# Patient Record
Sex: Female | Born: 1995 | Hispanic: Yes | Marital: Single | Smoking: Never smoker
Health system: Southern US, Community
[De-identification: ages and names within clinical notes are randomized; demographics above are authoritative.]

## PROBLEM LIST (undated history)

## (undated) ENCOUNTER — Ambulatory Visit: Admission: RE | Source: Ambulatory Visit | Attending: Gastroenterology | Admitting: Gastroenterology

## (undated) DIAGNOSIS — K5190 Ulcerative colitis, unspecified, without complications: Secondary | ICD-10-CM

## (undated) DIAGNOSIS — K219 Gastro-esophageal reflux disease without esophagitis: Secondary | ICD-10-CM

## (undated) DIAGNOSIS — K589 Irritable bowel syndrome without diarrhea: Secondary | ICD-10-CM

## (undated) DIAGNOSIS — F3281 Premenstrual dysphoric disorder: Secondary | ICD-10-CM

## (undated) DIAGNOSIS — F32A Depression, unspecified: Secondary | ICD-10-CM

## (undated) DIAGNOSIS — J302 Other seasonal allergic rhinitis: Secondary | ICD-10-CM

## (undated) DIAGNOSIS — F419 Anxiety disorder, unspecified: Secondary | ICD-10-CM

## (undated) DIAGNOSIS — B977 Papillomavirus as the cause of diseases classified elsewhere: Secondary | ICD-10-CM

## (undated) DIAGNOSIS — R87619 Unspecified abnormal cytological findings in specimens from cervix uteri: Secondary | ICD-10-CM

## (undated) DIAGNOSIS — A493 Mycoplasma infection, unspecified site: Secondary | ICD-10-CM

## (undated) DIAGNOSIS — E079 Disorder of thyroid, unspecified: Secondary | ICD-10-CM

## (undated) HISTORY — PX: SPLENECTOMY: SUR1306

## (undated) HISTORY — PX: WISDOM TOOTH EXTRACTION: SHX21

## (undated) HISTORY — PX: SPLENECTOMY, TOTAL: SHX788

## (undated) HISTORY — DX: Mycoplasma infection, unspecified site: A49.3

## (undated) HISTORY — DX: Unspecified abnormal cytological findings in specimens from cervix uteri: R87.619

## (undated) HISTORY — DX: Irritable bowel syndrome, unspecified: K58.9

## (undated) HISTORY — DX: Anxiety disorder, unspecified: F41.9

## (undated) HISTORY — DX: Premenstrual dysphoric disorder: F32.81

## (undated) HISTORY — DX: Other seasonal allergic rhinitis: J30.2

## (undated) HISTORY — DX: Depression, unspecified: F32.A

## (undated) HISTORY — DX: Ulcerative colitis, unspecified, without complications: K51.90

## (undated) HISTORY — PX: APPENDECTOMY (OPEN): SHX54

## (undated) HISTORY — DX: Disorder of thyroid, unspecified: E07.9

## (undated) HISTORY — DX: Papillomavirus as the cause of diseases classified elsewhere: B97.7

---
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
## 2014-01-25 ENCOUNTER — Emergency Department: Payer: Self-pay | Admitting: Emergency Medicine

## 2014-01-25 LAB — URINALYSIS, COMPLETE
BILIRUBIN, UR: NEGATIVE
Bacteria: NONE SEEN
Blood: NEGATIVE
Glucose,UR: NEGATIVE mg/dL (ref 0–75)
Ketone: NEGATIVE
LEUKOCYTE ESTERASE: NEGATIVE
Nitrite: NEGATIVE
PH: 5 (ref 4.5–8.0)
Protein: NEGATIVE
RBC,UR: 1 /HPF (ref 0–5)
SPECIFIC GRAVITY: 1.011 (ref 1.003–1.030)
WBC UR: 1 /HPF (ref 0–5)

## 2014-01-25 LAB — COMPREHENSIVE METABOLIC PANEL
ALT: 20 U/L
Albumin: 3.7 g/dL — ABNORMAL LOW (ref 3.8–5.6)
Alkaline Phosphatase: 94 U/L
Anion Gap: 9 (ref 7–16)
BUN: 11 mg/dL (ref 9–21)
Bilirubin,Total: 0.2 mg/dL (ref 0.2–1.0)
CREATININE: 0.67 mg/dL (ref 0.60–1.30)
Calcium, Total: 8.8 mg/dL — ABNORMAL LOW (ref 9.0–10.7)
Chloride: 107 mmol/L (ref 97–107)
Co2: 25 mmol/L (ref 16–25)
EGFR (African American): >60
EGFR (Non-African Amer.): >60
GLUCOSE: 100 mg/dL — AB (ref 65–99)
OSMOLALITY: 281 (ref 275–301)
Potassium: 4.3 mmol/L (ref 3.3–4.7)
SGOT(AST): 20 U/L (ref 0–26)
Sodium: 141 mmol/L (ref 132–141)
Total Protein: 7.5 g/dL (ref 6.4–8.6)

## 2014-01-25 LAB — CBC WITH DIFFERENTIAL/PLATELET
BASOS PCT: 0.7 %
Basophil #: 0.1 10*3/uL (ref 0.0–0.1)
EOS PCT: 7.4 %
Eosinophil #: 0.8 10*3/uL — ABNORMAL HIGH (ref 0.0–0.7)
HCT: 34.1 % — ABNORMAL LOW (ref 35.0–47.0)
HGB: 10.8 g/dL — ABNORMAL LOW (ref 12.0–16.0)
Lymphocyte #: 2.5 10*3/uL (ref 1.0–3.6)
Lymphocyte %: 24.5 %
MCH: 25.1 pg — AB (ref 26.0–34.0)
MCHC: 31.7 g/dL — ABNORMAL LOW (ref 32.0–36.0)
MCV: 79 fL — ABNORMAL LOW (ref 80–100)
MONO ABS: 1.1 x10 3/mm — AB (ref 0.2–0.9)
Monocyte %: 10.4 %
NEUTROS PCT: 57 %
Neutrophil #: 5.9 10*3/uL (ref 1.4–6.5)
PLATELETS: 686 10*3/uL — AB (ref 150–440)
RBC: 4.31 10*6/uL (ref 3.80–5.20)
RDW: 16.6 % — ABNORMAL HIGH (ref 11.5–14.5)
WBC: 10.4 10*3/uL (ref 3.6–11.0)

## 2014-01-25 LAB — LIPASE, BLOOD: LIPASE: 98 U/L (ref 73–393)

## 2014-06-18 ENCOUNTER — Encounter (INDEPENDENT_AMBULATORY_CARE_PROVIDER_SITE_OTHER): Payer: Self-pay | Admitting: Physician Assistant

## 2014-06-18 ENCOUNTER — Ambulatory Visit (INDEPENDENT_AMBULATORY_CARE_PROVIDER_SITE_OTHER): Payer: Commercial Managed Care - PPO | Admitting: Physician Assistant

## 2014-06-18 VITALS — BP 122/76 | HR 83 | Temp 98.2°F | Ht 65.0 in | Wt 173.0 lb

## 2014-06-18 DIAGNOSIS — M25561 Pain in right knee: Secondary | ICD-10-CM

## 2014-06-18 DIAGNOSIS — K51819 Other ulcerative colitis with unspecified complications: Secondary | ICD-10-CM

## 2014-06-18 DIAGNOSIS — M25562 Pain in left knee: Secondary | ICD-10-CM

## 2014-06-18 DIAGNOSIS — K5190 Ulcerative colitis, unspecified, without complications: Secondary | ICD-10-CM | POA: Insufficient documentation

## 2014-06-18 NOTE — Progress Notes (Signed)
Subjective:       Patient ID: Cheryl Mcneil is a 19 y.o. female.    Knee Pain   Incident onset: over 1 month. There was no injury mechanism. The pain is present in the right knee and left knee. The pain is moderate. The pain has been fluctuating since onset. Pertinent negatives include no inability to bear weight, loss of motion, numbness or tingling. She reports no foreign bodies present. The symptoms are aggravated by movement and weight bearing. She has tried acetaminophen for the symptoms. The treatment provided mild relief.   Her sx bother her most when she is dancing    Of note she was recent diagnosed with UC.  First flare was severe and she was hospitalized for 3 weeks for intestinal abscess.  Since that time she was on high dose steroids which she has tapered off from. She is closely followed by GI and reports compliance with current meds    Her knee sx stared as she tapered off the steroids    Current Outpatient Prescriptions   Medication Sig Dispense Refill   . azaTHIOprine (IMURAN) 50 MG tablet Take 50 mg by mouth daily.     . ferrous sulfate 220 (44 FE) MG/5ML solution Take 220 mg by mouth daily.     . mesalamine (LIALDA) 1.2 G EC tablet Take 1,200 mg by mouth 4 (four) times daily.     . pantoprazole (PROTONIX) 40 MG tablet Take 1 tablet (40 mg total) by mouth daily. 30 tablet 0   . b complex vitamins tablet Take 1 tablet by mouth daily.     . Cholecalciferol (VITAMIN D PO) Take by mouth.     . Multiple Vitamin (MULTIVITAMIN) tablet Take 1 tablet by mouth daily.     . ondansetron (ZOFRAN ODT) 8 MG disintegrating tablet Take 1 tablet (8 mg total) by mouth every 8 (eight) hours as needed for Nausea. 4 tablet 0     No current facility-administered medications for this visit.         The following portions of the patient's history were reviewed and updated as appropriate: allergies, current medications, past family history, past medical history, past social history, past surgical history and problem  list.    Review of Systems   Constitutional: Negative for fever and fatigue.   Gastrointestinal: Positive for abdominal pain and diarrhea. Negative for nausea, blood in stool and anal bleeding.   Musculoskeletal: Positive for arthralgias. Negative for gait problem.   Skin: Negative for color change.   Neurological: Negative for tingling, weakness and numbness.           Objective:    Physical Exam   Constitutional: She appears well-developed and well-nourished. No distress.   HENT:   Head: Normocephalic.   Musculoskeletal:        Right knee: She exhibits normal range of motion, no swelling, no effusion, no ecchymosis and normal alignment. No tenderness found.        Left knee: She exhibits normal range of motion, no swelling, no effusion and normal alignment. No tenderness found.           Assessment:       1. Other ulcerative colitis with complication    2. Knee pain, bilateral             Plan:      Procedures    1. Other ulcerative colitis with complication     2. Knee pain, bilateral  Ambulatory referral to Rheumatology  Follow up GI as planned  Tylenol PRN  Ice to area

## 2014-09-12 ENCOUNTER — Ambulatory Visit (INDEPENDENT_AMBULATORY_CARE_PROVIDER_SITE_OTHER): Payer: Commercial Managed Care - PPO | Admitting: Internal Medicine

## 2014-09-12 VITALS — BP 110/75 | HR 76 | Temp 98.4°F | Ht 66.0 in | Wt 173.0 lb

## 2014-09-12 DIAGNOSIS — K5120 Ulcerative (chronic) proctitis without complications: Secondary | ICD-10-CM

## 2014-09-12 DIAGNOSIS — M2550 Pain in unspecified joint: Secondary | ICD-10-CM | POA: Insufficient documentation

## 2014-09-12 DIAGNOSIS — M25569 Pain in unspecified knee: Secondary | ICD-10-CM | POA: Insufficient documentation

## 2014-09-12 LAB — C-REACTIVE PROTEIN: C-Reactive Protein: <0.1 mg/dL (ref 0.0–0.8)

## 2014-09-12 LAB — SEDIMENTATION RATE: Sed Rate: 7 mm/Hr (ref 0–20)

## 2014-09-12 LAB — RHEUMATOID FACTOR: Rheumatoid Factor: <10 (ref 0.0–30.0)

## 2014-09-12 NOTE — Patient Instructions (Signed)
1. Check RF, CCP, ANA, ESR and CRP   2. Continue Humira  3. Suggest sulfasalazine if still having joint pain   4. Follow 3-4 months. She will call labs results

## 2014-09-12 NOTE — Progress Notes (Signed)
Has the patient sought any care outside of the [facility]? YES    -Refer to care team-   Or  List Specialists:    Yes, listed under Care Team.

## 2014-09-12 NOTE — Progress Notes (Signed)
Initial Rheumatology Consultation    Chief Complaint:     Chief Complaint   Patient presents with   . Initial Consult     Knee Pain          HPI:   This patient is a 19 y.o. year old female with ulcerative colitis, s/p splenectomy  referred by Meth, Garlan Fair, PA to evaluate joint pain. She has intermittent pain of knees and ankles for 2-3 months. She was diagnosed with ulcerative colitis in 2015. She has been on Humira for 3 months. She tried imuran and mesalamine without help. Currently She follows with GI Dr. Elnoria Howard at [facility]. She has flare up with bloody diaheas 4-5 times per day. She takes Tylenol as needed.     Denies any fevers, chills or night sweats.  Denies fatigue or weight loss. Denies rash, malar rash or photosensitivity, discoid lesions, psoriasis, nail changes or Raynaud's. Denies hair loss or alopecia. Denies Sicca symptoms, nasal or oral ulcers or epistaxis. Denies cough, shortness of breath, chest pain, palpation or exercise intolerance. Denies nausea, vomiting, diarrhea, melena, hematochezia or dysphagia.  Denies dysuria and hematuria. Denies chronic headaches, paresthesias, focal weakness or seizure activity.      Denies any family history of lupus, rheumatoid arthritis, connective tissue disease, psoriasis, inflammatory bowel disease, ankylosing spondylitis, multiple sclerosis or other autoimmune diseases.        PMSH:     Past Medical History   Diagnosis Date   . Seasonal allergies    . Concussion 2014   . Ulcerative colitis        Past Surgical History   Procedure Laterality Date   . Wisdom tooth extraction  07/2012     All 4 tooth       Allergies:   No Known Allergies    Meds:     Current outpatient prescriptions:   .  Adalimumab (HUMIRA SC), Inject into the skin., Disp: , Rfl:   .  b complex vitamins tablet, Take 1 tablet by mouth daily., Disp: , Rfl:   .  ferrous sulfate 220 (44 FE) MG/5ML solution, Take 220 mg by mouth daily., Disp: , Rfl:   .  Multiple Vitamin (MULTIVITAMIN) tablet,  Take 1 tablet by mouth daily., Disp: , Rfl:   .  ondansetron (ZOFRAN ODT) 8 MG disintegrating tablet, Take 1 tablet (8 mg total) by mouth every 8 (eight) hours as needed for Nausea., Disp: 4 tablet, Rfl: 0    FH:     Family History   Problem Relation Age of Onset   . Heart disease Maternal Uncle      Pace marker   . Diabetes Maternal Grandmother    . Heart disease Maternal Grandmother    . Heart disease Maternal Grandfather    . Diabetes Paternal Grandmother    . Thyroid disease Paternal Grandmother    . Diabetes Paternal Grandfather    . Thyroid disease Father        SH:     History     Social History   . Marital Status: Single     Spouse Name: N/A   . Number of Children: N/A   . Years of Education: N/A     Occupational History   . Not on file.     Social History Main Topics   . Smoking status: Never Smoker    . Smokeless tobacco: Never Used   . Alcohol Use: No   . Drug Use: No   . Sexual Activity:  Not on file     Other Topics Concern   . Not on file     Social History Narrative       ROS:   All other systems reviewed and negative except as described above as HPI.       PHYSICAL EXAM:     Filed Vitals:    09/12/14 0917   BP: 110/75   Pulse: 76   Temp: 98.4 F (36.9 C)       General appearance - alert, well appearing, and in no distress  Mental status - alert, oriented to person, place, and time  Eyes - pupils equal and reactive, extraocular eye movements intact  Ears - bilateral TM's and external ear canals normal  Nose - normal and patent, no erythema, discharge or polyps  Mouth - mucous membranes moist, pharynx normal without lesions  Neck - supple, no significant adenopathy  Lymphatics - no palpable lymphadenopathy, no hepatosplenomegaly  Chest - clear to auscultation, no wheezes, rales or rhonchi, symmetric air entry  Heart - normal rate, regular rhythm, normal S1, S2, no murmurs, rubs, clicks or gallops  Abdomen - soft, nontender, nondistended, no masses or organomegaly  Back exam - full range of motion, no  tenderness, palpable spasm or pain on motion  Neurological - alert, oriented, normal speech, no focal findings or movement disorder noted  Musculoskeletal - Good range of cervical spine.  No crepitus on TMJ auscultation.  No tenderness of MCPs and PIPs. Good range of motion of shoulders, elbows, wrists and small joints of hands. Good range of motion of hips, knees and ankles.  Knees crepitus with range of motion of knees without effusions. Ankles without effusions.  No tenderness of MTPs or effusions.    Extremities - peripheral pulses normal, no pedal edema, no clubbing or cyanosis  Skin - normal coloration and turgor, no rashes, no suspicious skin lesions noted        Labs:       Radiology:         ASSESSMENT:   Cheryl Mcneil is a 19 y.o. female with ulcerative colitis, s/p splenectomy. 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 Will evaluate rheumatoid arthritis.       PLAN:   1. Check RF, CCP, ANA, ESR and CRP   2. Continue Humira  3. Suggest sulfasalazine if still having joint pain   4. Follow 3-4 months. She will call labs results

## 2014-09-14 LAB — CYCLIC CITRUL PEPTIDE ANTIBODY, IGG: CCP, Antibody IgG: <16 (ref <20)

## 2014-09-14 LAB — ANA IFA W/REFLEX TO TITER/PATTERN/AB: ANA Screen, IFA: NEGATIVE

## 2014-10-02 ENCOUNTER — Other Ambulatory Visit: Payer: Self-pay

## 2014-10-02 ENCOUNTER — Emergency Department
Admission: EM | Admit: 2014-10-02 | Discharge: 2014-10-02 | Disposition: A | Discharge status: Home / Self Care | Payer: Commercial Managed Care - PPO | Attending: Emergency Medicine | Admitting: Emergency Medicine

## 2014-10-02 ENCOUNTER — Ambulatory Visit (INDEPENDENT_AMBULATORY_CARE_PROVIDER_SITE_OTHER): Payer: Commercial Managed Care - PPO | Admitting: Family Medicine

## 2014-10-02 ENCOUNTER — Encounter (INDEPENDENT_AMBULATORY_CARE_PROVIDER_SITE_OTHER): Payer: Self-pay | Admitting: Family Medicine

## 2014-10-02 ENCOUNTER — Emergency Department: Payer: Commercial Managed Care - PPO

## 2014-10-02 VITALS — BP 117/68 | HR 96 | Temp 97.9°F | Ht 65.0 in | Wt 169.0 lb

## 2014-10-02 DIAGNOSIS — R1013 Epigastric pain: Secondary | ICD-10-CM

## 2014-10-02 DIAGNOSIS — K5190 Ulcerative colitis, unspecified, without complications: Secondary | ICD-10-CM | POA: Insufficient documentation

## 2014-10-02 DIAGNOSIS — K2970 Gastritis, unspecified, without bleeding: Secondary | ICD-10-CM | POA: Insufficient documentation

## 2014-10-02 LAB — CBC AND DIFFERENTIAL
Basophils Absolute Automated: 0.03 10*3/uL (ref 0.00–0.20)
Basophils Automated: 0 %
Eosinophils Absolute Automated: 0.22 10*3/uL (ref 0.00–0.70)
Eosinophils Automated: 1 %
Hematocrit: 37.5 % (ref 37.0–47.0)
Hgb: 12.9 g/dL (ref 12.0–16.0)
Immature Granulocytes Absolute: 0.05 10*3/uL
Immature Granulocytes: 0 %
Lymphocytes Absolute Automated: 3.17 10*3/uL (ref 0.50–4.40)
Lymphocytes Automated: 19 %
MCH: 28.4 pg (ref 28.0–32.0)
MCHC: 34.4 g/dL (ref 32.0–36.0)
MCV: 82.4 fL (ref 80.0–100.0)
MPV: 9.1 fL — ABNORMAL LOW (ref 9.4–12.3)
Monocytes Absolute Automated: 1.11 10*3/uL (ref 0.00–1.20)
Monocytes: 7 %
Neutrophils Absolute: 12.22 10*3/uL — ABNORMAL HIGH (ref 1.80–8.10)
Neutrophils: 73 %
Nucleated RBC: 0 /100 WBC (ref 0–1)
Platelets: 583 10*3/uL — ABNORMAL HIGH (ref 140–400)
RBC: 4.55 10*6/uL (ref 4.20–5.40)
RDW: 15 % (ref 12–15)
WBC: 16.8 10*3/uL — ABNORMAL HIGH (ref 3.50–10.80)

## 2014-10-02 LAB — HEPATIC FUNCTION PANEL
ALT: 8 U/L (ref 0–55)
AST (SGOT): 14 U/L (ref 5–34)
Albumin/Globulin Ratio: 1.5 (ref 0.9–2.2)
Albumin: 4.4 g/dL (ref 3.5–5.0)
Alkaline Phosphatase: 85 U/L (ref 50–130)
Bilirubin Direct: 0.2 mg/dL (ref 0.0–0.5)
Bilirubin Indirect: 0.4 mg/dL (ref 0.0–1.1)
Bilirubin, Total: 0.6 mg/dL (ref 0.2–1.2)
Globulin: 2.9 g/dL (ref 2.0–3.6)
Protein, Total: 7.3 g/dL (ref 6.0–8.3)

## 2014-10-02 LAB — URINALYSIS, REFLEX TO MICROSCOPIC EXAM IF INDICATED
Bilirubin, UA: NEGATIVE
Blood, UA: NEGATIVE
Glucose, UA: NEGATIVE
Ketones UA: 10
Leukocyte Esterase, UA: NEGATIVE
Nitrite, UA: NEGATIVE
Specific Gravity UA: 1.019 (ref 1.001–1.035)
Urine pH: 6.5 (ref 5.0–8.0)
Urobilinogen, UA: 2 mg/dL (ref 0.2–2.0)

## 2014-10-02 LAB — POCT PREGNANCY TEST, URINE HCG: POCT Pregnancy HCG Test, UR: NEGATIVE

## 2014-10-02 LAB — I-STAT CG4 VENOUS CARTRIDGE
Lactic Acid I-Stat: 0.8 mmol/L (ref 0.2–2.0)
i-STAT Base Excess Venous: 0 mEq/L
i-STAT FIO2: 21
i-STAT HCO3 Bicarbonate Venous: 25.3 mEq/L
i-STAT O2 Saturation Venous: 60 %
i-STAT Patient Temperature: 98.6
i-STAT Total CO2 Venous: 26 mEq/L
i-STAT pCO2 Venous: 40.6
i-STAT pH Venous: 7.402
i-STAT pO2 Venous: 31

## 2014-10-02 LAB — BASIC METABOLIC PANEL
BUN: 11 mg/dL (ref 7.0–19.0)
CO2: 23 mEq/L (ref 22–29)
Calcium: 9.7 mg/dL (ref 8.5–10.5)
Chloride: 107 mEq/L (ref 100–111)
Creatinine: 0.9 mg/dL (ref 0.6–1.0)
Glucose: 90 mg/dL (ref 70–100)
Potassium: 4 mEq/L (ref 3.5–5.1)
Sodium: 140 mEq/L (ref 136–145)

## 2014-10-02 LAB — LIPASE: Lipase: 18 U/L (ref 8–78)

## 2014-10-02 MED ORDER — ONDANSETRON HCL 4 MG/2ML IJ SOLN
4.0000 mg | Freq: Once | INTRAMUSCULAR | Status: AC
Start: 2014-10-02 — End: 2014-10-02
  Administered 2014-10-02: 4 mg via INTRAVENOUS
  Filled 2014-10-02: qty 2

## 2014-10-02 MED ORDER — ALUM & MAG HYDROXIDE-SIMETH 200-200-20 MG/5ML PO SUSP
30.0000 mL | Freq: Once | ORAL | Status: AC
Start: 2014-10-02 — End: 2014-10-02
  Administered 2014-10-02: 30 mL via ORAL
  Filled 2014-10-02: qty 30

## 2014-10-02 MED ORDER — FAMOTIDINE 10 MG/ML IV SOLN (WRAP)
20.0000 mg | Freq: Once | INTRAVENOUS | Status: AC
Start: 2014-10-02 — End: 2014-10-02
  Administered 2014-10-02: 20 mg via INTRAVENOUS
  Filled 2014-10-02: qty 2

## 2014-10-02 MED ORDER — PROMETHAZINE HCL 25 MG PO TABS
25.0000 mg | ORAL_TABLET | Freq: Four times a day (QID) | ORAL | 0 refills | Status: DC | PRN
Start: 2014-10-02 — End: 2014-10-03
  Filled 2014-10-02: qty 20, 5d supply, fill #0

## 2014-10-02 MED ORDER — HYDROMORPHONE HCL 1 MG/ML IJ SOLN
1.0000 mg | Freq: Once | INTRAMUSCULAR | Status: AC
Start: 2014-10-02 — End: 2014-10-02
  Administered 2014-10-02: 1 mg via INTRAVENOUS
  Filled 2014-10-02: qty 1

## 2014-10-02 MED ORDER — ONDANSETRON 8 MG PO TBDP
8.0000 mg | ORAL_TABLET | Freq: Three times a day (TID) | ORAL | Status: DC | PRN
Start: 2014-10-02 — End: 2014-10-03

## 2014-10-02 MED ORDER — LIDOCAINE VISCOUS 2 % MT SOLN
10.0000 mL | Freq: Once | OROMUCOSAL | Status: AC
Start: 2014-10-02 — End: 2014-10-02
  Administered 2014-10-02: 10 mL via OROMUCOSAL
  Filled 2014-10-02: qty 15

## 2014-10-02 MED ORDER — SODIUM CHLORIDE 0.9 % IV BOLUS
1000.0000 mL | Freq: Once | INTRAVENOUS | Status: AC
Start: 2014-10-02 — End: 2014-10-02
  Administered 2014-10-02: 1000 mL via INTRAVENOUS

## 2014-10-02 NOTE — ED Notes (Signed)
19 y.o. 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

## 2014-10-02 NOTE — ED Provider Notes (Signed)
[facility] EMERGENCY DEPARTMENT RESIDENT H&P       CLINICAL INFORMATION        HPI:      Chief Complaint: Abdominal Pain  .    Cheryl Mcneil is a 19 y.o. female with h/o UC on [place] and splenectomy following ?sepsis from liver abscess p/w 2 days of colicky epigastric and LLQ pain a/w nausea and vomiting. Pt was seen by PMD today and given Zofran but continued to vomit. 3 episodes of NBNB emesis today. Endorses loose BMs c/w UC flare. Missed last humera shot due to URI. Pt denies f/c, h/a, CP, SOB, pelvic pain, dysuria, hematuria, hematochezia. Last BM this morning.      History obtained from: patient, family             ROS:      Review of Systems   Constitutional: Positive for appetite change. Negative for fever, chills, diaphoresis, activity change and fatigue.   HENT: Negative.  Negative for sore throat, trouble swallowing and voice change.    Eyes: Negative.  Negative for visual disturbance.   Respiratory: Negative.  Negative for cough, choking, chest tightness, shortness of breath and wheezing.    Cardiovascular: Negative for chest pain, palpitations and leg swelling.   Gastrointestinal: Positive for abdominal pain. Negative for nausea, vomiting, constipation, blood in stool and abdominal distention.        Soft stools c/w baseline   Genitourinary: Negative.  Negative for dysuria, frequency, hematuria, flank pain and pelvic pain.   Musculoskeletal: Negative.  Negative for back pain, joint swelling and arthralgias.   Skin: Negative.    Neurological: Negative.  Negative for dizziness, syncope, facial asymmetry, weakness, light-headedness, numbness and headaches.         Physical Exam:      Pulse 88  BP 131/80 mmHg  Resp 18  SpO2 98 %  Temp 98.6 F (37 C)    Physical Exam   Constitutional: She is oriented to person, place, and time. She appears well-developed and well-nourished. No distress.   HENT:   Head: Normocephalic and atraumatic.   Mouth/Throat: Oropharynx is clear and  moist. No oropharyngeal exudate.   Eyes: Conjunctivae and EOM are normal. Pupils are equal, round, and reactive to light. No scleral icterus.   Neck: Normal range of motion.   Cardiovascular: Normal rate, regular rhythm and normal heart sounds.    No murmur heard.  Pulmonary/Chest: Effort normal and breath sounds normal. No respiratory distress. She has no wheezes.   Abdominal: Soft. Bowel sounds are normal. She exhibits no distension and no mass. There is tenderness. There is no rebound and no guarding. No hernia.   TTP in epigastric and LUQ. Tender parallel to scar from open splenectomy. No obvious hernia.    Musculoskeletal: Normal range of motion. She exhibits no edema or tenderness.   Lymphadenopathy:     She has no cervical adenopathy.   Neurological: She is alert and oriented to person, place, and time.   Skin: Skin is warm and dry. She is not diaphoretic.   Psychiatric: She has a normal mood and affect.   Vitals reviewed.              PAST HISTORY        Primary Care Provider: Roe Rutherford, PA        PMH/PSH:    .     Past Medical History   Diagnosis Date   . Seasonal allergies    . Concussion 2014   .  Ulcerative colitis        She has past surgical history that includes Wisdom tooth extraction (07/2012) and Splenectomy, total.      Social/Family History:      She reports that she has never smoked. She has never used smokeless tobacco. She reports that she does not drink alcohol or use illicit drugs.    Family History   Problem Relation Age of Onset   . Heart disease Maternal Uncle      Pace marker   . Diabetes Maternal Grandmother    . Heart disease Maternal Grandmother    . Heart disease Maternal Grandfather    . Diabetes Paternal Grandmother    . Thyroid disease Paternal Grandmother    . Diabetes Paternal Grandfather    . Thyroid disease Father          Listed Medications on Arrival:    .     Home Medications     Last Medication Reconciliation Action:  Complete Margy Clarks, RN 10/02/2014  4:55 PM                   Adalimumab (HUMIRA SC)     Inject into the skin.     b complex vitamins tablet     Take 1 tablet by mouth daily.     ferrous sulfate 220 (44 FE) MG/5ML solution     Take 220 mg by mouth daily.     mesalamine (LIALDA) 1.2 G EC tablet     Take 1,200 mg by mouth every morning with breakfast.     Multiple Vitamin (MULTIVITAMIN) tablet     Take 1 tablet by mouth daily.     ondansetron (ZOFRAN ODT) 8 MG disintegrating tablet     Take 1 tablet (8 mg total) by mouth every 8 (eight) hours as needed for Nausea.     pantoprazole (PROTONIX) 40 MG tablet     Take 40 mg by mouth daily.                   Allergies: She has No Known Allergies.            VISIT INFORMATION        Reassessments/Clinical Course:      Sxs c/w gastritis however pt is immunosuppressed and had multiple abdominal surgeries. Given GI cocktail. Concern for gallstones given colicky nature of pain however pt has had HIDA scan 1 year ago. Plan for RUQ u/s. SBO unlikely given recent bowel movement.    Lab work very reassuring. Leukocytosis likely due to splenectomy and h/o prednisone use. RUQ shows no acute pathology. Plan for d/c home with treatment for gastritis. Pt endorses poor diet with intake of acidic and harsh foods. Counseled on dietary changes. Plan for PMD follow up in 2-3 days.       Conversations with Other Providers:              Medications Given in the ED:    .     ED Medication Orders     Start Ordered     Status Ordering Provider    10/02/14 1700 10/02/14 1659  lidocaine viscous (XYLOCAINE) 2 % mouth solution 10 mL   Once     Route: Mouth/Throat  Ordered Dose: 10 mL     Ordered GIRMA, BELETSHACHEW R    10/02/14 1700 10/02/14 1659  alum & mag hydroxide-simethicone (MAALOX PLUS) 200-200-20 mg/5 mL suspension 30 mL   Once     Route:  Oral  Ordered Dose: 30 mL     Ordered GIRMA, BELETSHACHEW R    10/02/14 1700 10/02/14 1659  famotidine (PEPCID) injection 20 mg   Once     Route: Intravenous  Ordered Dose: 20 mg     Ordered GIRMA,  BELETSHACHEW R    10/02/14 1615 10/02/14 1614  ondansetron (ZOFRAN) injection 4 mg   Once     Route: Intravenous  Ordered Dose: 4 mg     Last MAR action:  Given WOODS, AMIE HALL    10/02/14 1615 10/02/14 1614  HYDROmorphone (DILAUDID) injection 1 mg   Once     Route: Intravenous  Ordered Dose: 1 mg     Last MAR action:  Given WOODS, AMIE HALL    10/02/14 1615 10/02/14 1614  sodium chloride 0.9 % bolus 1,000 mL   Once     Route: Intravenous  Ordered Dose: 1,000 mL     Last MAR action:  New Bag WOODS, AMIE HALL            Procedures:      Procedures      Assessment/Plan:      19 yo F p/w intermittent epigastric pain and nausea. Lab work and RUQ reassuring. Pt well appearing. Hx and PE c/w gastritis. Pt counseled on her diet. Plan for PMD f/u in 2-3 days.            Diego Cory, MD  Resident  10/02/14 1725    Diego Cory, MD  Resident  10/02/14 [phone]

## 2014-10-02 NOTE — Discharge Instructions (Signed)
Gastritis    You have been diagnosed with gastritis.    Gastritis is an irritation of the stomach lining. It has a number of causes including the use of aspirin and other anti-inflammatory medicines like ibuprofen (Advil or Motrin), naproxen (Naprosyn or Aleve), etc. Other causes are infections and too much stomach acid. Drinking alcohol can also cause gastritis.    You may be prescribed medicines to lower the amount of acid in the stomach or otherwise protect the stomach lining.    Use an antacid (Maalox and Mylanta) as directed on the bottle (1-2 tablespoons or 5-10 ml four times a day).    Avoid caffeine and alcohol. Avoid aspirin and other anti-inflammatory medicines like ibuprofen (Advil or Motrin), naproxen (Naprosyn or Aleve), etc. Acetaminophen (Tylenol) is safe for your stomach.    Spicy foods and acidic foods may increase pain. However, they do not damage the stomach lining. Avoid these foods if they cause pain.    You may be referred to a stomach specialist (Gastroenterologist) for further evaluation.    YOU SHOULD SEEK MEDICAL ATTENTION IMMEDIATELY, EITHER HERE OR AT THE NEAREST EMERGENCY DEPARTMENT, IF ANY OF THE FOLLOWING OCCURS:   Your pain suddenly gets worse.   You vomit (throw up) repeatedly or vomit blood or material that looks like "coffee grounds."   Any blood is in your stool or your stool gets very dark or looks like tar.   You develop a fever (temperature higher than 100.4F / 38C) or shaking chills.

## 2014-10-02 NOTE — Progress Notes (Signed)
Chief Complaint   Patient presents with   . Emesis     onset X1 day          S:    Pt with hx UC and currently on Humira.  Was with URI about 2 weeks ago and rheumatology advised holding Humira until sx resolved.  2 days ago with nausea and abdominal cramps and some intermittent vomiting (nonbloody and not black).  Transient tactile fever early this AM.  No dysuria.  +Diarrhea.  LMP a couple weeks ago but uses norplanon and no vaginal sx.    No Known Allergies    Current Outpatient Prescriptions   Medication Sig Dispense Refill   . Adalimumab (HUMIRA SC) Inject into the skin.     Marland Kitchen b complex vitamins tablet Take 1 tablet by mouth daily.     . ferrous sulfate 220 (44 FE) MG/5ML solution Take 220 mg by mouth daily.     . Multiple Vitamin (MULTIVITAMIN) tablet Take 1 tablet by mouth daily.     . ondansetron (ZOFRAN ODT) 8 MG disintegrating tablet Take 1 tablet (8 mg total) by mouth every 8 (eight) hours as needed for Nausea. 15 tablet 0     No current facility-administered medications for this visit.       Review of Systems   Constitutional: Positive for malaise/fatigue. Negative for fever, chills and diaphoresis.   HENT: Positive for congestion and sore throat. Negative for ear pain.    Respiratory: Positive for cough. Negative for shortness of breath and wheezing.    Cardiovascular: Negative.  Negative for chest pain.   Gastrointestinal: Positive for nausea and vomiting.   Genitourinary: Negative for dysuria, hematuria and flank pain.   Musculoskeletal: Negative for myalgias.   Skin: Negative for rash.   Neurological: Negative for dizziness, weakness and headaches.       All other systems were reviewed and are negative    O:  BP 117/68 mmHg  Pulse 96  Temp(Src) 97.9 F (36.6 C) (Oral)  Ht 1.651 m (5\' 5" )  Wt 76.658 kg (169 lb)  BMI 28.12 kg/m2, Body mass index is 28.12 kg/(m^2).  Vital signs reviewed  GEN:  NAD, nonobese  NEURO:  CN2-12 without focal defecit, nml gait and station  HEENT:  nml eyes, TMs, nares,  OP, no ac LA, normal thyroid evaluation  CVS:  rrr -m/r/g  ABD:  Soft, mild diffuse tenderness worst in epigastrum, ND, +BS  SKIN:  Warm, dry  EXT:  No edema, +2 radial pulse b/l    A/P:     1. Epigastric pain  ondansetron (ZOFRAN ODT) 8 MG disintegrating tablet         Risk & Benefits of the new medication(s) were explained to the patient (and family) who verbalized understanding & agreed to the treatment plan. Patient (family) encouraged to contact me/clinical staff with any questions/concerns    Raj Janus, MD

## 2014-10-02 NOTE — ED Provider Notes (Signed)
ATTENDING NOTE    I have reviewed and agree with history. The pertinent physical exam has been documented.   TEACHING PHYSICAN NOTE AND PE    seen and examined  agree with resident  19 y.o female with PMHX UC here with epigastric pain. Pt has chronic nausea on zofran continues to be nauseous. No fever chill cp sob. Sent by GI for sono. Pt non compliant with diet states has chocolate, tomato sauce .   vs noted  well appearing  cv rrr lung clear ab soft mild ttp epigastric ext no edema neuro non focal  a/p 19 y.o here with epigastric pain  lab sono  Reeval.  6:55 PM  Reexamined ab soft  Will d/c follow up with GI  tolerating po         Joice Lofts, MD  10/03/14 0200

## 2014-10-02 NOTE — Progress Notes (Signed)
1. Have you self referred yourself since we last saw you?Yes    Refer to care team   Or  Add specialists:  GI  Date:09/17/2014

## 2014-10-03 ENCOUNTER — Telehealth (INDEPENDENT_AMBULATORY_CARE_PROVIDER_SITE_OTHER): Payer: Self-pay

## 2014-10-03 ENCOUNTER — Emergency Department
Admission: EM | Admit: 2014-10-03 | Discharge: 2014-10-03 | Disposition: A | Discharge status: Home / Self Care | Payer: Commercial Managed Care - PPO | Attending: Emergency Medicine | Admitting: Emergency Medicine

## 2014-10-03 ENCOUNTER — Emergency Department: Payer: Commercial Managed Care - PPO

## 2014-10-03 DIAGNOSIS — R112 Nausea with vomiting, unspecified: Secondary | ICD-10-CM

## 2014-10-03 LAB — CBC AND DIFFERENTIAL
Basophils Absolute Automated: 0.03 10*3/uL (ref 0.00–0.20)
Basophils Automated: 0 %
Eosinophils Absolute Automated: 0.06 10*3/uL (ref 0.00–0.70)
Eosinophils Automated: 0 %
Hematocrit: 37.8 % (ref 37.0–47.0)
Hgb: 13.1 g/dL (ref 12.0–16.0)
Immature Granulocytes Absolute: 0.04 10*3/uL
Immature Granulocytes: 0 %
Lymphocytes Absolute Automated: 2.44 10*3/uL (ref 0.50–4.40)
Lymphocytes Automated: 15 %
MCH: 28.6 pg (ref 28.0–32.0)
MCHC: 34.7 g/dL (ref 32.0–36.0)
MCV: 82.5 fL (ref 80.0–100.0)
MPV: 8.9 fL — ABNORMAL LOW (ref 9.4–12.3)
Monocytes Absolute Automated: 1.08 10*3/uL (ref 0.00–1.20)
Monocytes: 6 %
Neutrophils Absolute: 12.85 10*3/uL — ABNORMAL HIGH (ref 1.80–8.10)
Neutrophils: 78 %
Nucleated RBC: 0 /100 WBC (ref 0–1)
Platelets: 566 10*3/uL — ABNORMAL HIGH (ref 140–400)
RBC: 4.58 10*6/uL (ref 4.20–5.40)
RDW: 15 % (ref 12–15)
WBC: 16.5 10*3/uL — ABNORMAL HIGH (ref 3.50–10.80)

## 2014-10-03 LAB — HEPATIC FUNCTION PANEL
ALT: 7 U/L (ref 0–55)
AST (SGOT): 13 U/L (ref 5–34)
Albumin/Globulin Ratio: 1.5 (ref 0.9–2.2)
Albumin: 4.2 g/dL (ref 3.5–5.0)
Alkaline Phosphatase: 80 U/L (ref 50–130)
Bilirubin Direct: 0.2 mg/dL (ref 0.0–0.5)
Bilirubin Indirect: 0.4 mg/dL (ref 0.0–1.1)
Bilirubin, Total: 0.6 mg/dL (ref 0.2–1.2)
Globulin: 2.8 g/dL (ref 2.0–3.6)
Protein, Total: 7 g/dL (ref 6.0–8.3)

## 2014-10-03 LAB — BASIC METABOLIC PANEL
BUN: 12 mg/dL (ref 7.0–19.0)
CO2: 19 mEq/L — ABNORMAL LOW (ref 22–29)
Calcium: 9.4 mg/dL (ref 8.5–10.5)
Chloride: 109 mEq/L (ref 100–111)
Creatinine: 0.8 mg/dL (ref 0.6–1.0)
Glucose: 90 mg/dL (ref 70–100)
Potassium: 4.1 mEq/L (ref 3.5–5.1)
Sodium: 140 mEq/L (ref 136–145)

## 2014-10-03 LAB — LIPASE: Lipase: 22 U/L (ref 8–78)

## 2014-10-03 MED ORDER — METOCLOPRAMIDE HCL 5 MG/ML IJ SOLN
10.0000 mg | Freq: Once | INTRAMUSCULAR | Status: AC
Start: 2014-10-03 — End: 2014-10-03
  Administered 2014-10-03: 10 mg via INTRAVENOUS
  Filled 2014-10-03: qty 2

## 2014-10-03 MED ORDER — DIPHENHYDRAMINE HCL 50 MG/ML IJ SOLN
25.0000 mg | Freq: Once | INTRAMUSCULAR | Status: AC
Start: 2014-10-03 — End: 2014-10-03
  Administered 2014-10-03: 25 mg via INTRAVENOUS
  Filled 2014-10-03: qty 1

## 2014-10-03 MED ORDER — DICYCLOMINE HCL 20 MG PO TABS
20.0000 mg | ORAL_TABLET | Freq: Three times a day (TID) | ORAL | Status: AC | PRN
Start: 2014-10-03 — End: 2014-10-13

## 2014-10-03 MED ORDER — SUCRALFATE 1 G PO TABS
1.0000 g | ORAL_TABLET | Freq: Four times a day (QID) | ORAL | Status: DC
Start: 2014-10-03 — End: 2023-11-03

## 2014-10-03 MED ORDER — PANTOPRAZOLE SODIUM 40 MG IV SOLR
40.0000 mg | Freq: Once | INTRAVENOUS | Status: AC
Start: 2014-10-03 — End: 2014-10-03
  Administered 2014-10-03: 40 mg via INTRAVENOUS
  Filled 2014-10-03: qty 40

## 2014-10-03 MED ORDER — METOCLOPRAMIDE HCL 5 MG PO TABS
5.0000 mg | ORAL_TABLET | Freq: Four times a day (QID) | ORAL | Status: DC
Start: 2014-10-03 — End: 2023-11-03

## 2014-10-03 MED ORDER — SODIUM CHLORIDE 0.9 % IV BOLUS
1000.0000 mL | Freq: Once | INTRAVENOUS | Status: DC
Start: 2014-10-03 — End: 2014-10-03

## 2014-10-03 MED ORDER — SODIUM CHLORIDE 0.9 % IV BOLUS
1000.0000 mL | Freq: Once | INTRAVENOUS | Status: AC
Start: 2014-10-03 — End: 2014-10-03
  Administered 2014-10-03: 1000 mL via INTRAVENOUS

## 2014-10-03 NOTE — Telephone Encounter (Signed)
ED Follow Up CALL:    EDVisit Date: 10/02/14  Primary Discharge Dx:Non-intractable vomiting with nausea, vomiting of unspecified type    Follow up Appt with PCP: None  Call placed to patient to follow up recent hospital discharge, assess current status, address questions/concerns regarding discharge instructions / medications and encourage patient to schedule Hospital Discharge follow up appt with PCP; No Answer; LVMM with contact information and request for return call.  Will continue to follow.

## 2014-10-03 NOTE — ED Notes (Signed)
Pt stated abdominal pain persists since yesterday but could not take medication that was prescribed yesterday.

## 2014-10-03 NOTE — Discharge Instructions (Signed)
TAKE MEDICATION AS PRESCRIBED AND CALL YOUR DOCTOR FOR FOLLOW UP TODAY: SEE GI AS SCHEDULED TODAY.    RETURN TO THE ER FOR WORSENING.    Nausea    You have been seen for nausea.    Nausea is the feeling that you are going to vomit (throw up). Nausea is not a disease. It is a symptom of another problem. For example, nausea, vomiting and diarrhea are symptoms of a stomach virus (the "stomach flu").    You may or may not vomit when you have nausea.     The nausea itself is not dangerous but it can be very uncomfortable.    We might not be able to find out today what is causing your nausea. It is VERY IMPORTANT to see your doctor who can watch for any serious problems.    There are many treatments for nausea. The medical staff will discuss these with you. Some common medicines used to help with nausea are   Promethazine (Phenergan), prochlorperazine (Compazine), metoclopramide (Reglan), ondansetron (Zofran), and many others.    Follow a clear liquid diet. Drink water, broth, 7-Up, Sprite, or other clear caffeine-free soft drinks or sports drinks until you feel better. This might help the nausea and keep you from vomiting.     If your nausea lasts for longer than a few days, or if you have new symptoms, we STRONGLY RECOMMEND that you go to see your family doctor, specialist or clinic. If you cannot get an appointment or do not have a doctor you can always return here or go to the nearest emergency department to be seen again.    YOU SHOULD SEEK MEDICAL ATTENTION IMMEDIATELY, EITHER HERE OR AT THE NEAREST EMERGENCY DEPARTMENT, IF ANY OF THE FOLLOWING OCCURS:   You vomit often.   You have abdominal (belly) pain.   You vomit blood or anything that looks like coffee grounds.   You have a headache.   You have any new symptoms or concerns.   You feel more "unwell."            Epigastric Abdominal Pain (Cause Unspecified)    You were treated for epigastric abdominal (belly) pain. We don t know the cause of  the pain yet.    Epigastric pain is located in the center of your upper belly right under your ribs.     There are several common causes of epigastric abdominal pain. These may include:     Gastroesophageal reflux disease (GERD) or heartburn: This is the most common cause. It usually happens right after eating.      Gastritis: This is inflammation of your stomach.     Lactose Intolerance: This means the body can t digest lactose. This is found in milk and other dairy products.     Pancreatitis: This is when your pancreas gets inflamed. Often the pain can be felt in the back.     Ulcers in your stomach or intestine.    Your doctor diagnosed your condition by your physical exam and your history. You may have also had imaging or blood work done to make sure you don't have any serious problems.     Treatment of epigastric abdominal pain focuses on making symptoms better. Fluids and electrolytes (sodium and potassium) may be given through an IV. Pain medications may be given.     Your doctor may give you something to lower acid production or coat the stomach lining.    Acid Reducing Medications - These medications  lower the amount of acid made in your stomach. This helps the inflammation in the stomach lining heal. Scheduling and dosing can vary, so follow the instructions carefully. Two of the common types of antacid medications are H2 blockers and proton pump inhibitors (PPIs). They include:    - H2 famotidine (Pepcid)  - H2 ranitidine (Zantac)  - PPI omeprazole (Prilosec)  - PPI lansoprazole (Prevacid)  - PPI esomeprazole (Nexium)  - PPI pantoprazole (Protonix)    Acid protective medications - These stick to damaged ulcer tissue and protect against acid and enzymes. This way, the ulcer can heal.     - Sucralafate (Carafate).    You have been evaluated, treated, and observed by your doctor.Your doctor feels thatyour condition has stabilized and it is safe for you to go home.    Though we  don't believe your condition is dangerous right now, it is important to be careful. Sometimes a problem that seems mild can become serious later. This is why it is very important that you return here or go to the nearest Emergency Department if you are not improving or your symptoms are getting worse.    Your doctor may have you follow up with your primary care doctor as an outpatient to check your condition. Follow up as directed.    Your doctor may prescribe you pain medications to treat yourpain. You can use over-the-counter medicines like acetaminophen (Tylenol). It is important to follow the directions for taking these medications.    Stool softeners - These medications help with the constipation caused by narcotic medications. You can use over-the-counter laxatives like milk of magnesia or magnesium citrate.    YOU SHOULD SEEK MEDICAL ATTENTION IMMEDIATELY, EITHER HERE OR AT THE NEAREST EMERGENCY DEPARTMENT, IF ANY OF THE FOLLOWING OCCUR:   You have sudden severe pain in your belly or chest.   Your pain gets worse or does not go away.   You throw up blood or see blood in your stool. Blood may be bright red or dark black and tarry.   Your skin or eyes look yellow or your urine (pee) looks dark brown.   You get a fever (temperature higher than 100.45F or 38C) or shaking chills.     If you can t follow up with your doctor, or if at any time you feel you need to be rechecked or seen again, come back here or go to the nearest emergency department.

## 2014-10-03 NOTE — ED Provider Notes (Signed)
The patient was seen and examined by the PA;  I have reviewed and agree with the history.  The pertinent physical exam has been documented and the plan of care was discussed with me.    HPI: 19 year old female w/ hx ulcerative colitis, p/w three days of persistent epigastric pain, a/w nausea and vomiting. She took phenergan last night with no alleviation. She woke this morning and took phenergan and pepcid, but she vomited. She spoke with a nurse at her GI office in [place] and advised to come to the ED. Denies diarrhea, no fever or chills.     PEx: gastritis/dyspepsia vs PUD vs pancreatitis vs biliary colic  Labs, meds, re-evaluate, refer to GI for an outpatient endoscopy    I was acting as a scribe for Alcide Evener, MD on Mcneil,Cheryl J  Merton Border    I am the first provider for this patient and I personally performed the services documented. Merton Border is scribing for me on Mcneil,Cheryl J. This note accurately reflects work and decisions made by me.  Alcide Evener, MD    Alcide Evener, MD  10/04/14 [phone]

## 2014-10-04 NOTE — ED Provider Notes (Signed)
EMERGENCY DEPARTMENT HISTORY AND PHYSICAL EXAM    Date: 10/04/2014  Patient Name: Cheryl Mcneil  Midlevel Provider: Gaynelle Arabian, NP  Diagnosis and Treatment Plan       Presumptive Diagnosis:   1. Non-intractable vomiting with nausea, vomiting of unspecified type         Treatment Plan: home, see patient instructions for treatment and plan    History of Presenting Illness     Chief Complaint:   Chief Complaint   Patient presents with   . Abdominal Pain     Cheryl Mcneil is a 19 y.o. female who  has a past medical history of Seasonal allergies; Concussion (2014); and Ulcerative colitis. c/o onset epigastric pain w n/v x 3 d. Has longstanding GI hx and not taking humira for several weeks d/t recent uri sx.  Pt was seen here 1 d ago w negative labs and u/s, [place] home w phenergan and pepcid d/t zofran not working at home.  Pt states she took the phenergan "but I still felt nauseated for an hour" and did not vomit, slept all night. States in AM she took Phenergan again "bs it was time" w pepcid, vomited x 1.  States she has had persistent epigastric pain since onset of symptoms which is "worse like someone is squeezing my stomach like as hard as they like possibly can" when she is vomiting and is less after but not resolved.  States she was supposed to see a GI MD in this area today, but she called her GI doctor in [place] and "the 24 nurse said I should come here."   Denies fcd, no ha, no dysuria/hematuria, no weakness, no numbness, no dizziness, no cp, no sob, no cough. Able to tol PO fluids.        PCP: Roe Rutherford, PA      Past Medical History     Past Medical History   Diagnosis Date   . Seasonal allergies    . Concussion 2014   . Ulcerative colitis        Past Surgical History   Procedure Laterality Date   . Wisdom tooth extraction  07/2012     All 4 tooth   . Splenectomy, total         Family History     Family History   Problem Relation Age of Onset   . Heart disease Maternal Uncle      Pace marker    . Diabetes Maternal Grandmother    . Heart disease Maternal Grandmother    . Heart disease Maternal Grandfather    . Diabetes Paternal Grandmother    . Thyroid disease Paternal Grandmother    . Diabetes Paternal Grandfather    . Thyroid disease Father        Social History     History     Social History   . Marital Status: Single     Spouse Name: N/A   . Number of Children: N/A   . Years of Education: N/A     Occupational History   . Not on file.     Social History Main Topics   . Smoking status: Never Smoker    . Smokeless tobacco: Never Used   . Alcohol Use: No      Comment: socially   . Drug Use: No   . Sexual Activity: No     Other Topics Concern   . Not on file     Social History Narrative  Allergies     No Known Allergies    Medications     No current facility-administered medications for this encounter.    Current outpatient prescriptions:   .  Adalimumab (HUMIRA SC), Inject into the skin., Disp: , Rfl:   .  b complex vitamins tablet, Take 1 tablet by mouth daily., Disp: , Rfl:   .  dicyclomine (BENTYL) 20 MG tablet, Take 1 tablet (20 mg total) by mouth every 8 (eight) hours as needed (As needed for cramping)., Disp: 30 tablet, Rfl: 0  .  ferrous sulfate 220 (44 FE) MG/5ML solution, Take 220 mg by mouth daily., Disp: , Rfl:   .  mesalamine (LIALDA) 1.2 G EC tablet, Take 1,200 mg by mouth every morning with breakfast., Disp: , Rfl:   .  metoclopramide (REGLAN) 5 MG tablet, Take 1 tablet (5 mg total) by mouth 4 (four) times daily., Disp: 20 tablet, Rfl: 0  .  Multiple Vitamin (MULTIVITAMIN) tablet, Take 1 tablet by mouth daily., Disp: , Rfl:   .  pantoprazole (PROTONIX) 40 MG tablet, Take 40 mg by mouth daily., Disp: , Rfl:   .  sucralfate (CARAFATE) 1 G tablet, Take 1 tablet (1 g total) by mouth 4 (four) times daily., Disp: 20 tablet, Rfl: 0    Review of Systems     Pertinent Positives and Negatives noted in the HPI.  All Other Systems Reviewed and Negative: Yes    Physical Exam   BP 111/62 mmHg   Pulse 66  Temp(Src) 98.9 F (37.2 C) (Oral)  Resp 18  Ht 5\' 5"  (1.651 m)  Wt 77.111 kg  BMI 28.29 kg/m2  SpO2 98%  LMP 07/28/2014    Constitutional: Vital signs reviewed.Well appearing. Easily Conversant  Head: Normocephalic, atraumatic  Eyes: No conjunctival injection. No discharge.  ENT: Mucous membranes moist.   Neck: Normal range of motion. Trachea midline.  Respiratory/Chest:  Clear to auscultation. No respiratory distress  Cardiovascular: Regular rate and rhythm.  No murmur. Peripheral pulses intact  Abdomen: Soft,non tender abd w/o rebound, bs x 4. No guarding. No masses or hepatosplenomegaly  No cvat  UpperExtremity:No edema or cyanosis  LowerExtremity:No edema or cyanosis  Neurological: No focal motor deficits by observation. Speech normal. Memory normal.  Skin: Warm and dry. No rash.  Psychiatric: Normal affect and normal concentration for age      Diagnostic Study Results     Labs -     Results     Procedure Component Value Units Date/Time    Basic Metabolic Panel (BMP) [[id number]]  (Abnormal) Collected:  10/03/14 1246    Specimen Information:  Blood Updated:  10/03/14 1310     Glucose 90 mg/dL      BUN 40.9 mg/dL      Creatinine 0.8 mg/dL      Calcium 9.4 mg/dL      Sodium 811 mEq/L      Potassium 4.1 mEq/L      Chloride 109 mEq/L      CO2 19 (L) mEq/L     Hepatic Function Panel (LFT) [[id number]] Collected:  10/03/14 1246    Specimen Information:  Blood Updated:  10/03/14 1310     Bilirubin, Total 0.6 mg/dL      Bilirubin, Direct 0.2 mg/dL      Bilirubin, Indirect 0.4 mg/dL      AST (SGOT) 13 U/L      ALT 7 U/L      Alkaline Phosphatase 80 U/L  Protein, Total 7.0 g/dL      Albumin 4.2 g/dL      Globulin 2.8 g/dL      Albumin/Globulin Ratio 1.5     Lipase [[id number]] Collected:  10/03/14 1246    Specimen Information:  Blood Updated:  10/03/14 1310     Lipase 22 U/L     CBC with Differential [[id number]]  (Abnormal) Collected:  10/03/14 1246    Specimen Information:  Blood from Blood Updated:   10/03/14 1257     WBC 16.50 (H) x10 3/uL      Hgb 13.1 g/dL      Hematocrit 46.9 %      Platelets 566 (H) x10 3/uL      RBC 4.58 x10 6/uL      MCV 82.5 fL      MCH 28.6 pg      MCHC 34.7 g/dL      RDW 15 %      MPV 8.9 (L) fL      Neutrophils 78 %      Lymphocytes Automated 15 %      Monocytes 6 %      Eosinophils Automated 0 %      Basophils Automated 0 %      Immature Granulocyte 0 %      Nucleated RBC 0 /100 WBC      Neutrophils Absolute 12.85 (H) x10 3/uL      Abs Lymph Automated 2.44 x10 3/uL      Abs Mono Automated 1.08 x10 3/uL      Abs Eos Automated 0.06 x10 3/uL      Absolute Baso Automated 0.03 x10 3/uL      Absolute Immature Granulocyte 0.04 x10 3/uL           Radiologic Studies -   Radiology Results (24 Hour)     ** No results found for the last 24 hours. **      .    Clinical Course in the Emergency Department     Stable and nad    REEVAL: tol po in ER and labs no worse than prior. Nontender abd w/o evident discomfort in ER from arrival to [place]    Will change outpt meds and again pt to fu outpt w GI    Medical Decision Making   I am the first provider for this patient.  I reviewed the vital signs, nursing notes, past medical history, past surgical history, family history and social history.  I have reviewed the patient's previous charts.  Vital Signs - BP 111/62 mmHg  Pulse 66  Temp(Src) 98.9 F (37.2 C) (Oral)  Resp 18  Ht 5\' 5"  (1.651 m)  Wt 77.111 kg  BMI 28.29 kg/m2  SpO2 98%  LMP 07/28/2014     Ddx: cholecystitis, biliary colic, cholelithiasis, CBD obstruction, choledocolithasis, GERD, IBS, ulc colitis, abd migraines    Plan: imaging, labs, iv hydration, add reglan  ____________________________________________________________________        Gaynelle Arabian, NP  10/04/14 [phone]

## 2014-10-12 ENCOUNTER — Encounter (INDEPENDENT_AMBULATORY_CARE_PROVIDER_SITE_OTHER): Payer: Self-pay

## 2014-10-12 DIAGNOSIS — Z9889 Other specified postprocedural states: Secondary | ICD-10-CM | POA: Insufficient documentation

## 2014-10-22 ENCOUNTER — Encounter (INDEPENDENT_AMBULATORY_CARE_PROVIDER_SITE_OTHER): Payer: Self-pay | Admitting: Physician Assistant

## 2014-12-13 ENCOUNTER — Encounter: Payer: Self-pay | Admitting: *Deleted

## 2014-12-13 DIAGNOSIS — Y9289 Other specified places as the place of occurrence of the external cause: Secondary | ICD-10-CM | POA: Insufficient documentation

## 2014-12-13 DIAGNOSIS — Y9389 Activity, other specified: Secondary | ICD-10-CM | POA: Diagnosis not present

## 2014-12-13 DIAGNOSIS — Y998 Other external cause status: Secondary | ICD-10-CM | POA: Diagnosis not present

## 2014-12-13 DIAGNOSIS — S81811A Laceration without foreign body, right lower leg, initial encounter: Secondary | ICD-10-CM | POA: Diagnosis present

## 2014-12-13 DIAGNOSIS — W25XXXA Contact with sharp glass, initial encounter: Secondary | ICD-10-CM | POA: Diagnosis not present

## 2014-12-13 DIAGNOSIS — Z79899 Other long term (current) drug therapy: Secondary | ICD-10-CM | POA: Insufficient documentation

## 2014-12-13 NOTE — ED Notes (Addendum)
Pt states she was taking out the trash and there was a glass cup that cut her right lower leg.

## 2014-12-14 ENCOUNTER — Emergency Department
Admission: EM | Admit: 2014-12-14 | Discharge: 2014-12-14 | Disposition: A | Discharge status: Home / Self Care | Payer: Managed Care, Other (non HMO) | Attending: Emergency Medicine | Admitting: Emergency Medicine

## 2014-12-14 ENCOUNTER — Encounter: Payer: Self-pay | Admitting: Emergency Medicine

## 2014-12-14 DIAGNOSIS — S81811A Laceration without foreign body, right lower leg, initial encounter: Secondary | ICD-10-CM

## 2014-12-14 HISTORY — DX: Ulcerative colitis, unspecified, without complications: K51.90

## 2014-12-14 HISTORY — DX: Gastro-esophageal reflux disease without esophagitis: K21.9

## 2014-12-14 MED ORDER — BACITRACIN ZINC 500 UNIT/GM EX OINT
TOPICAL_OINTMENT | Freq: Once | CUTANEOUS | Status: AC
Start: 2014-12-14 — End: 2014-12-14
  Administered 2014-12-14: 1 via TOPICAL

## 2014-12-14 MED ORDER — BACITRACIN ZINC 500 UNIT/GM EX OINT
TOPICAL_OINTMENT | CUTANEOUS | Status: AC
  Administered 2014-12-14: 1 via TOPICAL
  Filled 2014-12-14: qty 0.9

## 2014-12-14 MED ORDER — CEPHALEXIN 500 MG PO CAPS
ORAL_CAPSULE | ORAL | Status: AC
  Administered 2014-12-14: 500 mg via ORAL
  Filled 2014-12-14: qty 1

## 2014-12-14 MED ORDER — CEPHALEXIN 500 MG PO CAPS
500.0000 mg | ORAL_CAPSULE | Freq: Once | ORAL | Status: AC
  Administered 2014-12-14: 500 mg via ORAL

## 2014-12-14 MED ORDER — CEPHALEXIN 500 MG PO CAPS: 500.0000 mg | ORAL_CAPSULE | Freq: Two times a day (BID) | ORAL | Status: AC

## 2014-12-14 MED ORDER — LIDOCAINE HCL (PF) 1 % IJ SOLN
INTRAMUSCULAR | Status: AC
  Administered 2014-12-14: 10 mL
  Filled 2014-12-14: qty 10

## 2014-12-14 NOTE — ED Provider Notes (Signed)
[facility] Emergency Department Provider Note  ____________________________________________  Time seen: 1:00 AM  I have reviewed the triage vital signs and the nursing notes.   HISTORY  Chief Complaint Extremity Laceration      HPI Robyn Wright is a 19 y.o. female presents with history of accidental right leg laceration from a glass, while she was taking her trash out tonight. Of note patient is currently taking Humira for ulcerative colitis.    Past Medical History  Diagnosis Date  . Ulcerative colitis   . GERD (gastroesophageal reflux disease)     There are no active problems to display for this patient.   Past Surgical History  Procedure Laterality Date  . Splenectomy      Current Outpatient Rx  Name  Route  Sig  Dispense  Refill  . Adalimumab (HUMIRA) 40 MG/0.8ML PSKT   Subcutaneous   Inject 40 mg into the skin once a week.         . mesalamine (CANASA) 1000 MG suppository   Rectal   Place 1,000 mg rectally at bedtime.         . pantoprazole (PROTONIX) 40 MG tablet   Oral   Take 40 mg by mouth daily.           Allergies Review of patient's allergies indicates no known allergies.  History reviewed. No pertinent family history.  Social History Social History  Substance Use Topics  . Smoking status: Never Smoker   . Smokeless tobacco: None  . Alcohol Use: No    Review of Systems  Constitutional: Negative for fever. Eyes: Negative for visual changes. ENT: Negative for sore throat. Cardiovascular: Negative for chest pain. Respiratory: Negative for shortness of breath. Gastrointestinal: Negative for abdominal pain, vomiting and diarrhea. Genitourinary: Negative for dysuria. Musculoskeletal: Negative for back pain. Skin:Positive for right leg laceration  Neurological: Negative for headaches, focal weakness or numbness.  10-point ROS otherwise  negative.  ____________________________________________   PHYSICAL EXAM:  VITAL SIGNS: ED Triage Vitals  Enc Vitals Group     BP 12/13/14 2351 129/83 mmHg     Pulse Rate 12/13/14 2351 94     Resp 12/13/14 2351 16     Temp 12/13/14 2351 98.3 F (36.8 C)     Temp src --      SpO2 12/13/14 2351 97 %     Weight 12/13/14 2351 159 lb (72.122 kg)     Height 12/13/14 2351  (1.651 m)     Head Cir --      Peak Flow --      Pain Score 12/13/14 2351 3     Pain Loc --      Pain Edu? --      Excl. in GC? --      Constitutional: Alert and oriented. Well appearing and in no distress. Eyes: Conjunctivae are normal. PERRL. Normal extraocular movements. ENT   Head: Normocephalic and atraumatic.   Nose: No congestion/rhinnorhea.   Mouth/Throat: Mucous membranes are moist.   Neck: No stridor. Hematological/Lymphatic/Immunilogical: No cervical lymphadenopathy. Cardiovascular: Normal rate, regular rhythm. Normal and symmetric distal pulses are present in all extremities. No murmurs, rubs, or gallops. Respiratory: Normal respiratory effort without tachypnea nor retractions. Breath sounds are clear and equal bilaterally. No wheezes/rales/rhonchi. Gastrointestinal: Soft and nontender. No distention. There is no CVA tenderness. Genitourinary: deferred Musculoskeletal: Nontender with normal range of motion in all extremities. No joint effusions.  No lower extremity tenderness nor edema. Neurologic:  Normal speech  and language. No gross focal neurologic deficits are appreciated. Speech is normal.  Skin:  Right anterior leg 5 inch linear laceration  Psychiatric: Mood and affect are normal. Speech and behavior are normal. Patient exhibits appropriate insight and judgment.       PROCEDURES  Procedure(s) performed: LACERATION REPAIR Performed by: Darci Current Authorized by: Darci Current Consent: Verbal consent obtained. Risks and benefits: risks, benefits and  alternatives were discussed Consent given by: patient Patient identity confirmed: provided demographic data Prepped and Draped in normal sterile fashion Wound explored  Laceration Location: anterior right leg  Laceration Length: 5 inches No Foreign Bodies seen or palpated  Anesthesia: local infiltration  Local anesthetic: lidocaine 1%  Anesthetic total: 10 ml  Irrigation method: syringe Amount of cleaning: standard  Skin closure: 6-0 nylon   Number of sutures: 19  Technique: Simple interrupted   Patient tolerance: Patient tolerated the procedure well with no immediate complications.      ____________________________________________   INITIAL IMPRESSION / ASSESSMENT AND PLAN / ED COURSE  Pertinent labs & imaging results that were available during my care of the patient were reviewed by me and considered in my medical decision making (see chart for details).  Patient given Keflex prophylactically in the emergency department will be prescribed the same for home  ____________________________________________   FINAL CLINICAL IMPRESSION(S) / ED DIAGNOSES  Final diagnoses:  Leg laceration, right, initial encounter      Darci Current, MD 12/14/14 [phone]

## 2014-12-14 NOTE — ED Notes (Signed)
Wound dressed with sterile gauze and tape

## 2014-12-14 NOTE — Discharge Instructions (Signed)

## 2015-04-04 ENCOUNTER — Encounter: Payer: Self-pay | Admitting: Obstetrics and Gynecology

## 2015-04-30 ENCOUNTER — Encounter: Payer: Self-pay | Admitting: Obstetrics and Gynecology

## 2015-04-30 ENCOUNTER — Ambulatory Visit (INDEPENDENT_AMBULATORY_CARE_PROVIDER_SITE_OTHER): Payer: Managed Care, Other (non HMO) | Admitting: Obstetrics and Gynecology

## 2015-04-30 VITALS — BP 112/75 | HR 98 | Ht 66.0 in | Wt 145.7 lb

## 2015-04-30 DIAGNOSIS — Z01419 Encounter for gynecological examination (general) (routine) without abnormal findings: Secondary | ICD-10-CM

## 2015-04-30 DIAGNOSIS — Z113 Encounter for screening for infections with a predominantly sexual mode of transmission: Secondary | ICD-10-CM | POA: Diagnosis not present

## 2015-04-30 DIAGNOSIS — Z7185 Encounter for immunization safety counseling: Secondary | ICD-10-CM

## 2015-04-30 DIAGNOSIS — Z23 Encounter for immunization: Secondary | ICD-10-CM | POA: Diagnosis not present

## 2015-04-30 DIAGNOSIS — Z7189 Other specified counseling: Secondary | ICD-10-CM

## 2015-04-30 DIAGNOSIS — N921 Excessive and frequent menstruation with irregular cycle: Secondary | ICD-10-CM | POA: Diagnosis not present

## 2015-04-30 DIAGNOSIS — Z975 Presence of (intrauterine) contraceptive device: Secondary | ICD-10-CM

## 2015-04-30 NOTE — Patient Instructions (Signed)
RE: MyChart  Dear Robyn Wright  We are excited to introduce MyChart, a new best-in-class service that provides you online access to important information in your electronic medical record. We want to make it easier for you to view your health information - all in one secure location - when and where you need it. We expect MyChart will enhance the quality of care and service we provide. Use the activation code below to enroll in MyChart online at [email or web address]  When you register for MyChart, you can:  Marland Kitchen View your test results. . Communicate securely with your physician's office.  . View your medical history, allergies, medications, and immunizations. . Conveniently print information such as your medication lists.  If you are age 20 or older and want a member of your family to have access to your record, you must provide written consent by completing a proxy form available at our facility. Please speak to our clinical staff about guidelines regarding accounts for patients younger than age 60.  As you activate your MyChart account and need any technical assistance, please call the MyChart technical support line at [phone]) or email your question to [email or web address]. If you email your question(s), please include your name, a return phone number and the best time to reach you.  Thank you for using MyChart as your new health and wellness resource!  MyChart Activation Code:  [email or web address] Expires: 06/03/2015  1:59 PM    [place], [place] [id number]    Health Maintenance, Female Adopting a healthy lifestyle and getting preventive care can go a long way to promote health and wellness. Talk with your health care provider about what schedule of regular examinations is right for you. This is a good chance for you to check in with your provider about disease prevention and staying healthy. In between checkups, there  are plenty of things you can do on your own. Experts have done a lot of research about which lifestyle changes and preventive measures are most likely to keep you healthy. Ask your health care provider for more information. WEIGHT AND DIET  Eat a healthy diet  Be sure to include plenty of vegetables, fruits, low-fat dairy products, and lean protein.  Do not eat a lot of foods high in solid fats, added sugars, or salt.  Get regular exercise. This is one of the most important things you can do for your health.  Most adults should exercise for at least 150 minutes each week. The exercise should increase your heart rate and make you sweat (moderate-intensity exercise).  Most adults should also do strengthening exercises at least twice a week. This is in addition to the moderate-intensity exercise.  Maintain a healthy weight  Body mass index (BMI) is a measurement that can be used to identify possible weight problems. It estimates body fat based on height and weight. Your health care provider can help determine your BMI and help you achieve or maintain a healthy weight.  For females 101 years of age and older:   A BMI below 18.5 is considered underweight.  A BMI of 18.5 to 24.9 is normal.  A BMI of 25 to 29.9 is considered overweight.  A BMI of 30 and above is considered obese.  Watch levels of cholesterol and blood lipids  You should start having your blood tested for lipids and cholesterol at 21 years of age, then have this test every 5 years.  You may need  to have your cholesterol levels checked more often if:  Your lipid or cholesterol levels are high.  You are older than 21 years of age.  You are at high risk for heart disease.  CANCER SCREENING   Lung Cancer  Lung cancer screening is recommended for adults 74-3 years old who are at high risk for lung cancer because of a history of smoking.  A yearly low-dose CT scan of the lungs is recommended for people  who:  Currently smoke.  Have quit within the past 15 years.  Have at least a 30-pack-year history of smoking. A pack year is smoking an average of one pack of cigarettes a day for 1 year.  Yearly screening should continue until it has been 15 years since you quit.  Yearly screening should stop if you develop a health problem that would prevent you from having lung cancer treatment.  Breast Cancer  Practice breast self-awareness. This means understanding how your breasts normally appear and feel.  It also means doing regular breast self-exams. Let your health care provider know about any changes, no matter how small.  If you are in your 20s or 30s, you should have a clinical breast exam (CBE) by a health care provider every 1-3 years as part of a regular health exam.  If you are 81 or older, have a CBE every year. Also consider having a breast X-ray (mammogram) every year.  If you have a family history of breast cancer, talk to your health care provider about genetic screening.  If you are at high risk for breast cancer, talk to your health care provider about having an MRI and a mammogram every year.  Breast cancer gene (BRCA) assessment is recommended for women who have family members with BRCA-related cancers. BRCA-related cancers include:  Breast.  Ovarian.  Tubal.  Peritoneal cancers.  Results of the assessment will determine the need for genetic counseling and BRCA1 and BRCA2 testing. Cervical Cancer Your health care provider may recommend that you be screened regularly for cancer of the pelvic organs (ovaries, uterus, and vagina). This screening involves a pelvic examination, including checking for microscopic changes to the surface of your cervix (Pap test). You may be encouraged to have this screening done every 3 years, beginning at age 53.  For women ages 24-65, health care providers may recommend pelvic exams and Pap testing every 3 years, or they may recommend the  Pap and pelvic exam, combined with testing for human papilloma virus (HPV), every 5 years. Some types of HPV increase your risk of cervical cancer. Testing for HPV may also be done on women of any age with unclear Pap test results.  Other health care providers may not recommend any screening for nonpregnant women who are considered low risk for pelvic cancer and who do not have symptoms. Ask your health care provider if a screening pelvic exam is right for you.  If you have had past treatment for cervical cancer or a condition that could lead to cancer, you need Pap tests and screening for cancer for at least 20 years after your treatment. If Pap tests have been discontinued, your risk factors (such as having a new sexual partner) need to be reassessed to determine if screening should resume. Some women have medical problems that increase the chance of getting cervical cancer. In these cases, your health care provider may recommend more frequent screening and Pap tests. Colorectal Cancer  This type of cancer can be detected and often prevented.  Routine colorectal cancer screening usually begins at 21 years of age and continues through 21 years of age.  Your health care provider may recommend screening at an earlier age if you have risk factors for colon cancer.  Your health care provider may also recommend using home test kits to check for hidden blood in the stool.  A small camera at the end of a tube can be used to examine your colon directly (sigmoidoscopy or colonoscopy). This is done to check for the earliest forms of colorectal cancer.  Routine screening usually begins at age 21.  Direct examination of the colon should be repeated every 5-10 years through 21 years of age. However, you may need to be screened more often if early forms of precancerous polyps or small growths are found. Skin Cancer  Check your skin from head to toe regularly.  Tell your health care provider about any new  moles or changes in moles, especially if there is a change in a mole's shape or color.  Also tell your health care provider if you have a mole that is larger than the size of a pencil eraser.  Always use sunscreen. Apply sunscreen liberally and repeatedly throughout the day.  Protect yourself by wearing long sleeves, pants, a wide-brimmed hat, and sunglasses whenever you are outside. HEART DISEASE, DIABETES, AND HIGH BLOOD PRESSURE   High blood pressure causes heart disease and increases the risk of stroke. High blood pressure is more likely to develop in:  People who have blood pressure in the high end of the normal range (130-139/85-89 mm Hg).  People who are overweight or obese.  People who are African American.  If you are 62-67 years of age, have your blood pressure checked every 3-5 years. If you are 78 years of age or older, have your blood pressure checked every year. You should have your blood pressure measured twice--once when you are at a hospital or clinic, and once when you are not at a hospital or clinic. Record the average of the two measurements. To check your blood pressure when you are not at a hospital or clinic, you can use:  An automated blood pressure machine at a pharmacy.  A home blood pressure monitor.  If you are between 69 years and 24 years old, ask your health care provider if you should take aspirin to prevent strokes.  Have regular diabetes screenings. This involves taking a blood sample to check your fasting blood sugar level.  If you are at a normal weight and have a low risk for diabetes, have this test once every three years after 21 years of age.  If you are overweight and have a high risk for diabetes, consider being tested at a younger age or more often. PREVENTING INFECTION  Hepatitis B  If you have a higher risk for hepatitis B, you should be screened for this virus. You are considered at high risk for hepatitis B if:  You were born in a  country where hepatitis B is common. Ask your health care provider which countries are considered high risk.  Your parents were born in a high-risk country, and you have not been immunized against hepatitis B (hepatitis B vaccine).  You have HIV or AIDS.  You use needles to inject street drugs.  You live with someone who has hepatitis B.  You have had sex with someone who has hepatitis B.  You get hemodialysis treatment.  You take certain medicines for conditions, including cancer, organ  transplantation, and autoimmune conditions. Hepatitis C  Blood testing is recommended for:  Everyone born from [birth year] through 1966.  Anyone with known risk factors for hepatitis C. Sexually transmitted infections (STIs)  You should be screened for sexually transmitted infections (STIs) including gonorrhea and chlamydia if:  You are sexually active and are younger than 21 years of age.  You are older than 21 years of age and your health care provider tells you that you are at risk for this type of infection.  Your sexual activity has changed since you were last screened and you are at an increased risk for chlamydia or gonorrhea. Ask your health care provider if you are at risk.  If you do not have HIV, but are at risk, it may be recommended that you take a prescription medicine daily to prevent HIV infection. This is called pre-exposure prophylaxis (PrEP). You are considered at risk if:  You are sexually active and do not regularly use condoms or know the HIV status of your partner(s).  You take drugs by injection.  You are sexually active with a partner who has HIV. Talk with your health care provider about whether you are at high risk of being infected with HIV. If you choose to begin PrEP, you should first be tested for HIV. You should then be tested every 3 months for as long as you are taking PrEP.  PREGNANCY   If you are premenopausal and you may become pregnant, ask your health care  provider about preconception counseling.  If you may become pregnant, take 400 to 800 micrograms (mcg) of folic acid every day.  If you want to prevent pregnancy, talk to your health care provider about birth control (contraception). OSTEOPOROSIS AND MENOPAUSE   Osteoporosis is a disease in which the bones lose minerals and strength with aging. This can result in serious bone fractures. Your risk for osteoporosis can be identified using a bone density scan.  If you are 51 years of age or older, or if you are at risk for osteoporosis and fractures, ask your health care provider if you should be screened.  Ask your health care provider whether you should take a calcium or vitamin D supplement to lower your risk for osteoporosis.  Menopause may have certain physical symptoms and risks.  Hormone replacement therapy may reduce some of these symptoms and risks. Talk to your health care provider about whether hormone replacement therapy is right for you.  HOME CARE INSTRUCTIONS   Schedule regular health, dental, and eye exams.  Stay current with your immunizations.   Do not use any tobacco products including cigarettes, chewing tobacco, or electronic cigarettes.  If you are pregnant, do not drink alcohol.  If you are breastfeeding, limit how much and how often you drink alcohol.  Limit alcohol intake to no more than 1 drink per day for nonpregnant women. One drink equals 12 ounces of beer, 5 ounces of wine, or 1 ounces of hard liquor.  Do not use street drugs.  Do not share needles.  Ask your health care provider for help if you need support or information about quitting drugs.  Tell your health care provider if you often feel depressed.  Tell your health care provider if you have ever been abused or do not feel safe at home.   This information is not intended to replace advice given to you by your health care provider. Make sure you discuss any questions you have with your health  care provider.  Document Released: 09/29/2010 Document Revised: 04/06/2014 Document Reviewed: 02/15/2013 Elsevier Interactive Patient Education 2016 [employer].      HPV (Human Papillomavirus) Vaccine--Gardasil-9:  1. Why get vaccinated? Gardasil-9 prevents human papillomavirus (HPV) types that cause many cancers, including:  cervical cancer in females,  vaginal and vulvar cancers in females,  anal cancer in females and males,  throat cancer in females and males, and  penile cancer in males. In addition, Gardasil-9 prevents HPV types that cause genital warts in both females and males. In the [place] about 12,000 women get cervical cancer every year, and about 4,000 women die from it. Robyn Wright can prevent most of these cases of cervical cancer. Vaccination is not a substitute for cervical cancer screening. This vaccine does not protect against all HPV types that can cause cervical cancer. Women should still get regular Pap tests. HPV infection usually comes from sexual contact, and most people will become infected at some point in their life. About 14 million Americans, including teens, get infected every year. Most infections will go away and not cause serious problems. But thousands of women and men get cancer and diseases from HPV. 2. HPV vaccine Robyn Wright is an FDA-approved HPV vaccine. It is recommended for both males and females. It is routinely given at 87 or 21 years of age, but it may be given beginning at age 23 years through age 10 years. Three doses of Gardasil-9 are recommended with the second dose given 1-2 months after the first dose and the third dose given 6 months after the first dose. 3. Some people should not get this vaccine  Anyone who has had a severe, life-threatening allergic reaction to a dose of HPV vaccine should not get another dose.  Anyone who has a severe (life threatening) allergy to any component of HPV vaccine should not get the vaccine. Tell  your doctor if you have any severe allergies that you know of, including a severe allergy to yeast.  HPV vaccine is not recommended for pregnant women. If you learn that you were pregnant when you were vaccinated, there is no reason to expect any problems for you or your baby. Any woman who learns she was pregnant when she got Gardasil-9 vaccine is encouraged to contact the manufacturer's registry for HPV vaccination during pregnancy at (206) [phone]. Women who are breastfeeding may be vaccinated.  If you have a mild illness, such as a cold, you can probably get the vaccine today. If you are moderately or severely ill, you should probably wait until you recover. Your doctor can advise you. 4. Risks of a vaccine reaction With any medicine, including vaccines, there is a chance of side effects. These are usually mild and go away on their own, but serious reactions are also possible. Most people who get HPV vaccine do not have any serious problems with it. Mild or moderate problems following Gardasil-9:  Reactions in the arm where the shot was given:  Soreness (about 9 people in 10)  Redness or swelling (about 1 person in 3)  Fever:  Mild (100F) (about 1 person in 10)  Moderate (102F) (about 1 person in 40)  Other problems:  Headache (about 1 person in 3) Problems that could happen after any injected vaccine:  People sometimes faint after a medical procedure, including vaccination. Sitting or lying down for about 15 minutes can help prevent fainting, and injuries caused by a fall. Tell your doctor if you feel dizzy, or have vision changes or ringing in the ears.  Some people get severe pain in the shoulder and have difficulty moving the arm where a shot was given. This happens very rarely.  Any medication can cause a severe allergic reaction. Such reactions from a vaccine are very rare, estimated at about 1 in a million doses, and would happen within a few minutes to a few hours after  the vaccination. As with any medicine, there is a very remote chance of a vaccine causing a serious injury or death. The safety of vaccines is always being monitored. For more information, visit: http://www.aguilar.org/. 5. What if there is a serious reaction? What should I look for? Look for anything that concerns you, such as signs of a severe allergic reaction, very high fever, or unusual behavior. Signs of a severe allergic reaction can include hives, swelling of the face and throat, difficulty breathing, a fast heartbeat, dizziness, and weakness. These would usually start a few minutes to a few hours after the vaccination. What should I do? If you think it is a severe allergic reaction or other emergency that can't wait, call 9-1-1 or get to the nearest hospital. Otherwise, call your doctor. Afterward, the reaction should be reported to the "Vaccine Adverse Event Reporting System" (VAERS). Your doctor might file this report, or you can do it yourself through the VAERS web site at www.vaers.SamedayNews.es, or by calling 7877461686. VAERS does not give medical advice. 6. The National Vaccine Injury Compensation Program The [employer] Vaccine Injury Compensation Program (VICP) is a federal program that was created to compensate people who may have been injured by certain vaccines. Persons who believe they may have been injured by a vaccine can learn about the program and about filing a claim by calling (724)888-5307 or visiting the [place] website at GoldCloset.com.ee. There is a time limit to file a claim for compensation. 7. How can I learn more?  Ask your health care provider. He or she can give you the vaccine package insert or suggest other sources of information.  Call your local or state health department.  Contact the Centers for Disease Control and Prevention (CDC):  Call (772)283-2220 (1-800-CDC-INFO) or  Visit CDC's website at http://sweeney-todd.com/ Vaccine Information  Statement HPV Vaccine Robyn Wright) 06/28/14   This information is not intended to replace advice given to you by your health care provider. Make sure you discuss any questions you have with your health care provider.   Document Released: 10/11/2013 Document Revised: 07/31/2014 Document Reviewed: 10/11/2013 Elsevier Interactive Patient Education [employer].

## 2015-05-01 ENCOUNTER — Other Ambulatory Visit: Payer: Self-pay | Admitting: Obstetrics and Gynecology

## 2015-05-01 ENCOUNTER — Encounter: Payer: Self-pay | Admitting: Obstetrics and Gynecology

## 2015-05-01 LAB — COMPREHENSIVE METABOLIC PANEL
ALBUMIN: 4.5 g/dL (ref 3.5–5.5)
ALK PHOS: 70 IU/L (ref 39–117)
ALT: 10 IU/L (ref 0–32)
AST: 16 IU/L (ref 0–40)
Albumin/Globulin Ratio: 1.9 (ref 1.1–2.5)
BILIRUBIN TOTAL: 0.5 mg/dL (ref 0.0–1.2)
BUN / CREAT RATIO: 12 (ref 8–20)
BUN: 9 mg/dL (ref 6–20)
CO2: 22 mmol/L (ref 18–29)
CREATININE: 0.73 mg/dL (ref 0.57–1.00)
Calcium: 9.6 mg/dL (ref 8.7–10.2)
Chloride: 105 mmol/L (ref 96–106)
GFR calc non Af Amer: 119 mL/min/{1.73_m2} (ref >59)
GFR, EST AFRICAN AMERICAN: 137 mL/min/{1.73_m2} (ref >59)
GLOBULIN, TOTAL: 2.4 g/dL (ref 1.5–4.5)
Glucose: 84 mg/dL (ref 65–99)
Potassium: 4.1 mmol/L (ref 3.5–5.2)
SODIUM: 143 mmol/L (ref 134–144)
TOTAL PROTEIN: 6.9 g/dL (ref 6.0–8.5)

## 2015-05-01 LAB — CBC
HEMATOCRIT: 38.5 % (ref 34.0–46.6)
HEMOGLOBIN: 12.8 g/dL (ref 11.1–15.9)
MCH: 28.1 pg (ref 26.6–33.0)
MCHC: 33.2 g/dL (ref 31.5–35.7)
MCV: 84 fL (ref 79–97)
Platelets: 521 10*3/uL — ABNORMAL HIGH (ref 150–379)
RBC: 4.56 x10E6/uL (ref 3.77–5.28)
RDW: 15 % (ref 12.3–15.4)
WBC: 7.5 10*3/uL (ref 3.4–10.8)

## 2015-05-01 LAB — HEP, RPR, HIV PANEL
HEP B S AG: NEGATIVE
HIV Screen 4th Generation wRfx: NONREACTIVE
RPR Ser Ql: NONREACTIVE

## 2015-05-01 NOTE — Progress Notes (Signed)
GYNECOLOGY ANNUAL PHYSICAL EXAM PROGRESS NOTE  Subjective:    Robyn Wright is a 20 y.o. P0 female who presents for an annual exam. The patient has no complaints today. The patient is sexually active. The patient wears seatbelts: yes. The patient participates in regular exercise: yes. Has the patient ever been transfused or tattooed?: no. The patient reports that there is not domestic violence in her life.    Gynecologic History Patient's last menstrual period was 03/31/2015.  Menarche age: 35. Reports normal occuring menses.  Contraception: Nexplanon (inserted in 2015) Last Pap: No pap history   Obstetric History   G0   P0   T0   P0   A0   TAB0   SAB0   E0   M0   L0       Past Medical History  Diagnosis Date  . Ulcerative colitis (HCC)   . GERD (gastroesophageal reflux disease)     Past Surgical History  Procedure Laterality Date  . Splenectomy    . Wisdom tooth extraction      Family History  Problem Relation Age of Onset  . Hypertension Mother   . Hashimoto's thyroiditis Father     Social History   Social History  . Marital Status: Single    Spouse Name: N/A  . Number of Children: N/A  . Years of Education: N/A   Occupational History  . Not on file.   Social History Main Topics  . Smoking status: Never Smoker   . Smokeless tobacco: Not on file  . Alcohol Use: No  . Drug Use: No  . Sexual Activity: Yes    Birth Control/ Protection: Implant   Other Topics Concern  . Not on file   Social History Narrative    Current Outpatient Prescriptions on File Prior to Visit  Medication Sig Dispense Refill  . Adalimumab (HUMIRA) 40 MG/0.8ML PSKT Inject 40 mg into the skin every 14 (fourteen) days.     . pantoprazole (PROTONIX) 40 MG tablet Take 40 mg by mouth daily.    . mesalamine (CANASA) 1000 MG suppository Place 1,000 mg rectally at bedtime. Reported on 04/30/2015     No current facility-administered medications on file prior to visit.    No  Known Allergies   Review of Systems Constitutional: negative for chills, fatigue, fevers and sweats Eyes: negative for irritation, redness and visual disturbance Ears, nose, mouth, throat, and face: negative for hearing loss, nasal congestion, snoring and tinnitus Respiratory: negative for asthma, cough, sputum Cardiovascular: negative for chest pain, dyspnea, exertional chest pressure/discomfort, irregular heart beat, palpitations and syncope Gastrointestinal: negative for abdominal pain, change in bowel habits, nausea and vomiting Genitourinary: positive for irregular menstrual periods on Nexplanon (however not bothersome, not frequent), and vaginal itching just prior to menses;  negative for genital lesions, sexual problems and vaginal discharge, dysuria and urinary incontinence Integument/breast: negative for breast lump, breast tenderness and nipple discharge Hematologic/lymphatic: negative for bleeding and easy bruising Musculoskeletal:negative for back pain and muscle weakness Neurological: negative for dizziness, headaches, vertigo and weakness Endocrine: negative for diabetic symptoms including polydipsia, polyuria and skin dryness Allergic/Immunologic: negative for hay fever and urticaria      Objective:  Blood pressure 112/75, pulse 98, height  (1.676 m), weight 145 lb 11.2 oz (66.089 kg), last menstrual period 03/31/2015. Body mass index is 23.53 kg/(m^2).   General Appearance:    Alert, cooperative, no distress, appears stated age  Head:    Normocephalic, without obvious abnormality,  atraumatic  Eyes:    PERRL, conjunctiva/corneas clear, EOM's intact, both eyes  Ears:    Normal external ear canals, both ears  Nose:   Nares normal, septum midline, mucosa normal, no drainage or sinus tenderness  Throat:   Lips, mucosa, and tongue normal; teeth and gums normal  Neck:   Supple, symmetrical, trachea midline, no adenopathy; thyroid: no enlargement/tenderness/nodules; no  carotid bruit or JVD  Back:     Symmetric, no curvature, ROM normal, no CVA tenderness  Lungs:     Clear to auscultation bilaterally, respirations unlabored  Chest Wall:    No tenderness or deformity   Heart:    Regular rate and rhythm, S1 and S2 normal, no murmur, rub or gallop  Breast Exam:    No tenderness, masses, or nipple abnormality  Abdomen:     Soft, non-tender, bowel sounds active all four quadrants, no masses, no organomegaly.    Genitalia:    Pelvic:external genitalia normal, vagina without lesions, discharge, or tenderness, rectovaginal septum  normal. Cervix normal in appearance, no cervical motion tenderness, no adnexal masses or tenderness.  Uterus normal size, shape, mobile, regular contours, nontender.  Rectal:    Normal external sphincter.  No hemorrhoids appreciated. Internal exam not done.   Extremities:   Extremities normal, atraumatic, no cyanosis or edema  Pulses:   2+ and symmetric all extremities  Skin:   Skin color, texture, turgor normal, no rashes or lesions  Lymph nodes:   Cervical, supraclavicular, and axillary nodes normal  Neurologic:   CNII-XII intact, normal strength, sensation and reflexes throughout     Assessment:    Healthy female exam.  Normal BMI  Breakthrough bleeding on Nexplanon H/o ulcerative colitis  Plan:    Pap smear not indicated until age 24.  Breast self exam technique reviewed and patient encouraged to perform self-exam monthly. Chlamydia/GC specimen (at patient request). Contraception: Nexplanon. Notes breakthrough bleeding is tolerable for now, does not desire removal.  Discussed healthy lifestyle modifications and safe sex practices. Patient s/p colonoscopy last year, UC is in remission, does not require next colonoscopy for 8 years.  Discussed that vaginal itching just prior to menses is likely hormonal, however still discussed good hygiene practices.  Up to date on flu vaccine.  Discussed Gardasil vaccine.  Patient notes  receiving initial dose 3 years ago but did not follow up.  Desires to resume.  WIll need to restart series.  Given initial dose today.  To f/u in 2 months and 6 months for next 2 vaccine doses.  RTC in 1 year for annual exam.     Hildred Laser, MD [facility]

## 2015-05-09 ENCOUNTER — Telehealth: Payer: Self-pay

## 2015-05-09 DIAGNOSIS — N760 Acute vaginitis: Principal | ICD-10-CM

## 2015-05-09 DIAGNOSIS — B9689 Other specified bacterial agents as the cause of diseases classified elsewhere: Secondary | ICD-10-CM

## 2015-05-09 MED ORDER — METRONIDAZOLE 500 MG PO TABS: 500.0000 mg | ORAL_TABLET | Freq: Two times a day (BID) | ORAL | Status: DC

## 2015-05-09 NOTE — Telephone Encounter (Signed)
LM for pt informing her of BV, and the need for treatment with Flagyl. RX sent in. Per Dr.Cherry disreguard treatment of Mycoplasma.

## 2015-05-09 NOTE — Telephone Encounter (Signed)
-----   Message from Hildred Laser, MD sent at 05/08/2015  2:34 PM EST ----- Patient with vaginal swab positive for Mycoplasma and BV.  Will need treatment with 1 gram Azithromycin (PO x 1 dose), followed by Flagyl (gel form x 5 nights or pill form BID x 7 days).

## 2015-06-27 ENCOUNTER — Ambulatory Visit: Payer: Managed Care, Other (non HMO)

## 2015-07-04 ENCOUNTER — Ambulatory Visit: Payer: Managed Care, Other (non HMO) | Admitting: Sports Medicine

## 2015-07-05 ENCOUNTER — Ambulatory Visit (INDEPENDENT_AMBULATORY_CARE_PROVIDER_SITE_OTHER): Payer: Managed Care, Other (non HMO) | Admitting: Sports Medicine

## 2015-07-05 ENCOUNTER — Ambulatory Visit: Payer: Managed Care, Other (non HMO)

## 2015-07-05 ENCOUNTER — Encounter: Payer: Self-pay | Admitting: Sports Medicine

## 2015-07-05 VITALS — BP 119/74 | HR 95 | Ht 65.0 in | Wt 146.0 lb

## 2015-07-05 DIAGNOSIS — S060X0A Concussion without loss of consciousness, initial encounter: Secondary | ICD-10-CM | POA: Diagnosis not present

## 2015-07-05 NOTE — Progress Notes (Signed)
   Subjective:    Patient ID: Robyn Wright, female    DOB: 04-Aug-1995, 20 y.o.   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 While participating in dance class, she was performing a controlled collapse but hit her head on the ground. She had immediate symptoms including headache, dizziness, balance issues, and a feeling of "being off". She was evaluated by the athletic trainer and diagnosed with a concussion. She was out of class completely for 4 days and has gradually returned. She tells me that she has now been in class for a total of 2 weeks with spring break occurring in the middle. For the most part, she thinks her symptoms may have resolved but she has other medical conditions that cause her fatigue and emotional problems. Specifically, she has a history of depression and anxiety as well as ulcerative colitis and so she has a baseline of fatigue and emotional lability which is not necessarily associated with her current concussion. She has yet to repeat her ImPACT test. She has had one prior concussion but it was a rather severe one. It occurred in November 2014 and she was not cleared for activity until the following April.   Past medical history reviewed. It is significant for the aforementioned depression and anxiety as well as ulcerative colitis   medications reviewed. She is on Humira and protonix. No known drug allergies   surgical history significant for a prior splenectomy     Review of Systems     as above  Objective:   Physical Exam  well-developed, fit appearing. No acute distress. Awake alert and oriented 3. Vital signs reviewed   neurological exam shows cranial nerves II through XII to be grossly intact. She has excellent finger to nose. No symptoms with horizontal or vertical saccades. Her balance testing is good. She has no issues with double leg or  tandem stance. She did stumble a bit with single leg stance but was able to recover quickly. Patient states she feels like she has returned to her baseline balance.      Assessment & Plan:   Concussion  I need to see her repeat ImPACT test before clearing her. She has several symptoms at baseline from her ulcerative colitis and her depression which may mimic postconcussion syndrome. If she has returned to her baseline with her impact test, then I think we can begin to progress her back into full activities including dance. However, if her test is significantly below baseline, I would recommend waiting another week or 2 before retesting her. I will contact the athletic trainer at [employer] to discuss this with her.Given her prolonged recovery with her prior concussion, I think we need to be very conservative with our treatment.

## 2015-07-08 ENCOUNTER — Ambulatory Visit: Payer: Managed Care, Other (non HMO)

## 2015-08-28 ENCOUNTER — Encounter (INDEPENDENT_AMBULATORY_CARE_PROVIDER_SITE_OTHER): Payer: Self-pay | Admitting: Physician Assistant

## 2015-08-29 ENCOUNTER — Ambulatory Visit (INDEPENDENT_AMBULATORY_CARE_PROVIDER_SITE_OTHER): Payer: Commercial Managed Care - PPO | Admitting: Psychiatry

## 2015-08-29 ENCOUNTER — Encounter (HOSPITAL_BASED_OUTPATIENT_CLINIC_OR_DEPARTMENT_OTHER): Payer: Self-pay

## 2015-08-29 VITALS — BP 122/74 | HR 88 | Temp 98.6°F | Resp 14 | Ht 65.0 in | Wt 150.0 lb

## 2015-08-29 DIAGNOSIS — F332 Major depressive disorder, recurrent severe without psychotic features: Secondary | ICD-10-CM

## 2015-08-29 MED ORDER — SERTRALINE HCL 50 MG PO TABS
50.0000 mg | ORAL_TABLET | Freq: Every day | ORAL | Status: AC
Start: 2015-08-29 — End: 2016-08-28

## 2015-08-29 NOTE — Progress Notes (Signed)
[facility]) Walk-In Evaluation    Date/Time:  08/29/2015  4:45 PM  Patient Name: KEYRA, VIRELLA  MRN:  [id number]  Age: 20 y.o.  DOB: 07/10/1995    PART-1  TRIAGE     Vital Signs:     Filed Vitals:    08/29/15 1638   BP: 122/74   Pulse: 88   Temp: 98.6 F (37 C)   Resp: 14   SpO2: 98%       Presenting problem/Expectations for today's visit ( brief)   Consultation and prescription for anxiety and depression.  (Concussion this year and not coping as well)    Screening questions:   Current or recent Suicidal ideation/intent or plan: Yes, describe Passive no plans  and no intent to act.  No guns in the home.  Adequate support.  Contracts for safety.  Current or recent Homicidal ideation/intent or plan:No    Any drugs: Yes, describe Marijuana on occasions.    Alcohol: Yes, describe 0-2 week  Any Withdrawal symptoms:? No    Hallucinations: No      Pt will be seen by next provider.  Daniyal Tabor HARRIS-PIER

## 2015-08-29 NOTE — Progress Notes (Signed)
[facility]iatric Evaluation    Date/Time:   08/29/2015  4:55 PM  Name:  Cheryl Mcneil, Cheryl Mcneil  MRN:    [id number]  Age:   20 y.o.  DOB:   [birth year]  Sex:  female    CHIEF COMPLAINT  "Depression"    HISTORY OF PRESENT ILLNESS  Cheryl Mcneil reports depression since she was 20 years old. She reports that she has had multiple time periods of worsening depression. She reports that her most recent episode of depression started with a concussion that occurred in Feb of this year. She indicated that her boyfriend broke up 1 week ago after 5 months of dating. She reports moderate constant depression, with associated hopelessness and passive death wish.    She reports moderate frequent panic episodes, with associated severe anxiety, heart racing, shaking, and fidgeting.    12 years old grandfather had a heart attack in front of her. Had a severe infection and was hospitalized for 3 weeks    PSYCHIATRIC REVIEW OF SYMPTOMS  Subjective Mood: "Depressed"   Sleep: Onset difficulty, Maintenance difficulty   Appetite/Weight: Increased / Subjective sense of gain   Focus & Concentration: Poor Focus/Distractible   Energy Level: Decreased   Delusions:  Denies Delusional Content   Hallucinations: Denies any Hallucinosis   Suicide or Self-Injury?: No: Passive death wish.   Homicide or Violence?: No   Access to Guns?: No       PAST PSYCHIATRIC HISTORY  Current Provider(s) Associate Professor   Diagnoses Anxiety Disorder, Major Depression   Previous Medications Denies   Hospitalizations No   Suicide Attempts No    Self Injury No   Violence to Others No   Head Injury Yes: Multiple. Senior year of HS dropped during dancing. Last in Feb 2017 while dancing.   Seizures No   Suicide Exposure No     Family History   Problem Relation Age of Onset   . Heart disease Maternal Uncle      Pace marker   . Diabetes Maternal Grandmother    . Heart disease Maternal Grandmother    . Heart disease Maternal Grandfather    . Diabetes  Paternal Grandmother    . Thyroid disease Paternal Grandmother    . Diabetes Paternal Grandfather    . 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   Alcohol 1 - 2 drinks on weekends on some weekends.   Tobacco History   Smoking status   . Never Smoker    Smokeless tobacco   . Never Used      Treatments None     MEDICAL HISTORY    Current/Home Medications    ADALIMUMAB (HUMIRA SC)    Inject into the skin.    B COMPLEX VITAMINS TABLET    Take 1 tablet by mouth daily.    FERROUS SULFATE 220 (44 FE) MG/5ML SOLUTION    Take 220 mg by mouth daily.    MESALAMINE (LIALDA) 1.2 G EC TABLET    Take 1,200 mg by mouth every morning with breakfast.    METOCLOPRAMIDE (REGLAN) 5 MG TABLET    Take 1 tablet (5 mg total) by mouth 4 (  four) times daily.    MULTIPLE VITAMIN (MULTIVITAMIN) TABLET    Take 1 tablet by mouth daily.    PANTOPRAZOLE (PROTONIX) 40 MG TABLET    Take 40 mg by mouth daily.    SUCRALFATE (CARAFATE) 1 G TABLET    Take 1 tablet (1 g total) by mouth 4 (four) times daily.       Past Medical History   Diagnosis Date   . Seasonal allergies    . Concussion 2014   . Ulcerative colitis    . Anxiety    . Depression    . IBS (irritable bowel syndrome)        Past Surgical History   Procedure Laterality Date   . Wisdom tooth extraction  07/2012     All 4 tooth   . Splenectomy, total         Allergies   Allergen Reactions   . Other      Tumeric and polllon          PSYCHIATRIC SPECIALITY & MENTAL STATUS EXAM  Vital Signs LMP Vitals   Item Reading   . BP 122/74   . Pulse 88   . Temp 98.6 F (37 C)   . Resp 14   . Ht 1.651 m (5\' 5" )   . Wt 68.04 kg (150 lb)      General Appearance Neatly groomed, appropriately dressed and adequately nourished    Muskuloskeletal No weakness, abnormal movements, or other impairments   Gait/Station  No impairments to gait/station   Speech Normal Rate, Rythym, & Volume   Thought Process Logical, Linear, Goal Directed   Associations Intact    Thought Content No evidence of homicidal, suicidal, violent, or delusional thought content   Perceptions No evidence of hallucinosis   Judgment No Impairment   Insight  Good   LOC/Orientation A&O x 4, Sensorium Clear   Memory Intact   Attention & Concentration Normal   Fund of Knowledge Adequate given patient age, socioeconomic status, and educational level   Language Fluent with no impairments in comprehension or expression   Mood Depressed, Anxious   Affect Anxious, Tearful       ASSESSMENT  20 y.o. female with history of UC and allergies, p/w depression. SSRI and therapy would be beneficial. Patient will return to school in August and will follow up with [facility] vs community psychiatrist until then.    Encounter Diagnosis   Name Primary?   . Severe episode of recurrent major depressive disorder, without psychotic features Yes       PLAN  Treatment options and alternatives reviewed with patient, along with detailed discussion of medication(s) and side effects, and they concur with following plan:    Medications:   Start Zoloft 50mg  PO daily    Therapies:   Psychotherapy: Patient has existing therapist   PHP: PHP not clinically relevant at this time    Labs/Other:   Deferred    DISPOSITION & FOLLOW-UP   Discharge to: Home in care of self   Follow-up: 1 - 2 months.   Return to [facility]    _____________________________________________  Ella Jubilee, MD

## 2015-08-29 NOTE — Patient Instructions (Addendum)
MEDICATION INSTRUCTIONS                     Take all medications as prescribed; do not stop medications or change dosages without talking to your provider(s).   Abstain from alcohol and/or illegal drugs as they interfere with psychiatric medications.    Immediately go to the nearest emergency department or call 911 if you have any thoughts of wanting to harm yourself or others, or for any other crisis.   Consult with your pharmacist if you questions about your medications, their side effects or possible interactions with other medications you take.      FOLLOW-UP CARE APPOINTMENTS     PSYCHIATRIC MEDICATION MANAGEMENT: Start Zoloft 25mg  (1/2 tablets) by mouth daily for 4 days, then increase to 50mg  (1 tablet) by mouth daily.    PSYCHOTHERAPY: If you do not already have a therapist, find one by contact your insurance provider for a list of in-network providers. Time-limited, psychotherapy programs are available through [place] by calling [phone]. Another source for therapists is http://www.psychology-today.com/.   Return to [facility] in 1 - 2 months, or anytime if necessary (but not for routine matters like refills). [facility] Walk-in hours are 10a-6p Monday thru Saturday, excluding Thanksgiving, Christmas and New Years Day. We close early at 3pm on the first Wednesday of each month for staff meetings.   If you do not already have a psychiatrist and you would like to establish care in the [facility], please stop by the front desk prior to leaving and make and appointment. You may come back to [facility] any time prior to your appointment for follow up visits, as well as for urgent visits after you have started to see your psychiatrist at [facility].      ABOUT YOUR MEDICATIONS:      Sertraline tablets  What is this medicine?  SERTRALINE (SER tra leen) is used to treat depression. It may also be used to treat obsessive compulsive disorder, panic disorder, post-trauma stress, premenstrual  dysphoric disorder (PMDD) or social anxiety.  How should I use this medicine?  Take this medicine by mouth with a glass of water. Follow the directions on the prescription label. You can take it with or without food. Take your medicine at regular intervals. Do not take your medicine more often than directed. Do not stop taking this medicine suddenly except upon the advice of your doctor. Stopping this medicine too quickly may cause serious side effects or your condition may worsen.  A special MedGuide will be given to you by the pharmacist with each prescription and refill. Be sure to read this information carefully each time.  Talk to your pediatrician regarding the use of this medicine in children. While this drug may be prescribed for children as young as 7 years for selected conditions, precautions do apply.  What side effects may I notice from receiving this medicine?  Side effects that you should report to your doctor or health care professional as soon as possible:   allergic reactions like skin rash, itching or hives, swelling of the face, lips, or tongue   black or bloody stools, blood in the urine or vomit   fast, irregular heartbeat   feeling faint or lightheaded, falls   hallucination, loss of contact with reality   seizures   suicidal thoughts or other mood changes   unusual bleeding or bruising   unusually weak or tired   vomiting  Side effects that usually do  not require medical attention (report to your doctor or health care professional if they continue or are bothersome):   change in appetite   change in sex drive or performance   diarrhea   increased sweating   indigestion, nausea   tremors  What may interact with this medicine?  Do not take this medicine with any of the following medications:   certain medicines for fungal infections like fluconazole, itraconazole, ketoconazole, posaconazole, voriconazole   cisapride   disulfiram   dofetilide   linezolid   MAOIs like Carbex,  Eldepryl, Marplan, Nardil, and Parnate   metronidazole   methylene blue (injected into a vein)   pimozide   thioridazine   ziprasidone  This medicine may also interact with the following medications:   alcohol   aspirin and aspirin-like medicines   certain medicines for depression, anxiety, or psychotic disturbances   certain medicines for irregular heart beat like flecainide, propafenone   certain medicines for migraine headaches like almotriptan, eletriptan, frovatriptan, naratriptan, rizatriptan, sumatriptan, zolmitriptan   certain medicines for sleep   certain medicines for seizures like carbamazepine, valproic acid, phenytoin   certain medicines that treat or prevent blood clots like warfarin, enoxaparin, dalteparin   cimetidine   digoxin   diuretics   fentanyl   furazolidone   isoniazid   lithium   NSAIDs, medicines for pain and inflammation, like ibuprofen or naproxen   other medicines that prolong the QT interval (cause an abnormal heart rhythm)   procarbazine   rasagiline   supplements like St. John's wort, kava kava, valerian   tolbutamide   tramadol   tryptophan  What if I miss a dose?  If you miss a dose, take it as soon as you can. If it is almost time for your next dose, take only that dose. Do not take double or extra doses.  Where should I keep my medicine?  Keep out of the reach of children.  Store at room temperature between 15 and 30 degrees C (59 and 86 degrees F). Throw away any unused medicine after the expiration date.  What should I tell my health care provider before I take this medicine?  They need to know if you have any of these conditions:   bipolar disorder or a family history of bipolar disorder   diabetes   glaucoma   heart disease   high blood pressure   history of irregular heartbeat   history of low levels of calcium, magnesium, or potassium in the blood   if you often drink alcohol   liver disease   receiving electroconvulsive  therapy   seizures   suicidal thoughts, plans, or attempt; a previous suicide attempt by you or a family member   thyroid disease   an unusual or allergic reaction to sertraline, other medicines, foods, dyes, or preservatives   pregnant or trying to get pregnant   breast-feeding  What should I watch for while using this medicine?  Tell your doctor if your symptoms do not get better or if they get worse. Visit your doctor or health care professional for regular checks on your progress. Because it may take several weeks to see the full effects of this medicine, it is important to continue your treatment as prescribed by your doctor.  Patients and their families should watch out for new or worsening thoughts of suicide or depression. Also watch out for sudden changes in feelings such as feeling anxious, agitated, panicky, irritable, hostile, aggressive, impulsive, severely restless, overly  excited and hyperactive, or not being able to sleep. If this happens, especially at the beginning of treatment or after a change in dose, call your health care professional.  Bonita Quin may get drowsy or dizzy. Do not drive, use machinery, or do anything that needs mental alertness until you know how this medicine affects you. Do not stand or sit up quickly, especially if you are an older patient. This reduces the risk of dizzy or fainting spells. Alcohol may interfere with the effect of this medicine. Avoid alcoholic drinks.  Your mouth may get dry. Chewing sugarless gum or sucking hard candy, and drinking plenty of water may help. Contact your doctor if the problem does not go away or is severe.  Date Last Reviewed:   NOTE:This sheet is a summary. It may not cover all possible information. If you have questions about this medicine, talk to your doctor, pharmacist, or health care provider. Copyright 2016 Gold Standard              [facility]    [facility] Call Center  - [phone] 24/7/365  For admissions and screening  for all [facility] Services, including:   Comprehensive Addiction Treatment Services (CATS) Inpatient Detox, IOP Intensive Outpatient Programs   Partial Hospitalization Program (PHP), Outpatient psychiatry, Outpatient counseling        [facility]  [facility]  [address]  [place], [place] [address] For Urgent Adult (18 and Over) Psychiatric Assessments [phone]     [facility]  [address]  [place], [place] [address]   For Child and Youth (Under 18) Mental Health and Substance Abuse Outpatient Services [phone]   [facility]- [place]  [address]  [place], [place] [address] For Non-Urgent Psychiatric Appointments: [phone]   [facility]-   [facility]  [address]  [place], [place] [address]   For Non-Urgent Psychiatric Appointments: [phone]   [facility] [place]  [address] [place], [place] [address]   For Non-Urgent Psychiatric Appointments: [phone]   [facility]- [address]  [address]  [place], [place] [address]   For Non-Urgent Psychiatric Appointments: [phone]   [facility] [place]  [address]   [place], [place] [address]   For Non-Urgent Psychiatric Appointments: [phone]         COMMUNITY RESOURCES (MENTAL HEALTH CENTERS):      [facility]  Entry and Referral Services [phone]     [facility], [place], [place] [phone]   [facility], [place], [place] [phone]     [facility], [place], [place] [phone]     [facility], [place], [place] [phone]     [facility], [place], [place]  [phone]       [facility], [place], [place] [phone]     [facility], [place], [place]  [phone]       [facility] [address]  [place]) [phone]              [facility] - [phone]

## 2015-09-11 ENCOUNTER — Encounter (INDEPENDENT_AMBULATORY_CARE_PROVIDER_SITE_OTHER): Payer: Self-pay | Admitting: Physician Assistant

## 2015-11-13 ENCOUNTER — Ambulatory Visit (INDEPENDENT_AMBULATORY_CARE_PROVIDER_SITE_OTHER): Payer: Commercial Managed Care - PPO | Admitting: Physician Assistant

## 2015-11-13 ENCOUNTER — Encounter (INDEPENDENT_AMBULATORY_CARE_PROVIDER_SITE_OTHER): Payer: Self-pay | Admitting: Physician Assistant

## 2015-11-13 VITALS — BP 124/87 | HR 80 | Temp 98.1°F | Wt 155.8 lb

## 2015-11-13 DIAGNOSIS — H6592 Unspecified nonsuppurative otitis media, left ear: Secondary | ICD-10-CM

## 2015-11-13 MED ORDER — AMOXICILLIN 500 MG PO CAPS
500.0000 mg | ORAL_CAPSULE | Freq: Three times a day (TID) | ORAL | Status: AC
Start: 2015-11-13 — End: 2015-11-23

## 2015-11-13 NOTE — Progress Notes (Signed)
Have you sought any care outside of the [facility]?  No

## 2015-11-13 NOTE — Progress Notes (Signed)
Subjective:       Patient ID: Cheryl Mcneil is a 20 y.o. female.    Ear Fullness   There is pain in the left ear. This is a new problem. The current episode started in the past 7 days. The problem occurs constantly. The problem has been unchanged. Associated symptoms include headaches and rhinorrhea. Pertinent negatives include no abdominal pain, coughing, ear discharge or sore throat. She has tried ear drops for the symptoms. The treatment provided no relief.       The following portions of the patient's history were reviewed and updated as appropriate: allergies, current medications, past family history, past medical history, past social history, past surgical history and problem list.    Review of Systems   Constitutional: Negative for fever, chills, activity change, appetite change and fatigue.   HENT: Positive for congestion, ear pain, rhinorrhea and sinus pressure. Negative for ear discharge and sore throat.    Respiratory: Negative for cough.    Gastrointestinal: Negative for abdominal pain.   Neurological: Positive for headaches. Negative for dizziness.           Objective:    Physical Exam   Constitutional: She appears well-developed and well-nourished. No distress.   HENT:   Head: Normocephalic.   Right Ear: No middle ear effusion.   Left Ear: Tympanic membrane is not erythematous. A middle ear effusion is present.   Nose: Mucosal edema and rhinorrhea present.  No foreign bodies.   Mouth/Throat: Uvula is midline, oropharynx is clear and moist and mucous membranes are normal.   Neck: Neck supple.   Cardiovascular: Normal rate, regular rhythm and normal heart sounds.    Pulmonary/Chest: Effort normal and breath sounds normal. No respiratory distress. She has no wheezes.   Abdominal: Soft. Bowel sounds are normal.   Lymphadenopathy:     She has no cervical adenopathy.   Skin: Skin is warm and dry. No rash noted.           Assessment:       1. Left non-suppurative otitis media             Plan:       Procedures    1. Left non-suppurative otitis media  amoxicillin (AMOXIL) 500 MG capsule     Please take antibiotics as prescribed  Tylenol or Motrin for pain and/or fever  Call with any worsening symptoms  Hand washing   Risk & Benefits of the new medication(s) were explained to the pt (and family) who appeared to understand & agree to the treatment plan.

## 2015-11-30 ENCOUNTER — Other Ambulatory Visit (HOSPITAL_BASED_OUTPATIENT_CLINIC_OR_DEPARTMENT_OTHER): Payer: Self-pay | Admitting: Psychiatry

## 2015-12-16 ENCOUNTER — Telehealth (INDEPENDENT_AMBULATORY_CARE_PROVIDER_SITE_OTHER): Payer: Self-pay | Admitting: Family Medicine

## 2015-12-16 NOTE — Telephone Encounter (Signed)
Pl call to schedule an appt

## 2015-12-16 NOTE — Telephone Encounter (Signed)
she staying in [place].   Would call back to schedule an appt

## 2015-12-16 NOTE — Telephone Encounter (Signed)
After hours call:  About 45 minutes ago she was bitten by a cat, while holding it.  Small wound, did not bleed, maybe puncture, no pain,  She washed the are.  Wondering if she needs to be seen now because she is on Humira.  Advised wash the area w/ water and soap for about 10 minutes.  Does not need to be seen now, wait a few hours until tomorrow.  Call office in AM for app.  She agreed with plan.

## 2015-12-18 ENCOUNTER — Ambulatory Visit
Admission: RE | Admit: 2015-12-18 | Discharge: 2015-12-18 | Disposition: A | Discharge status: Home / Self Care | Payer: Managed Care, Other (non HMO) | Source: Ambulatory Visit | Attending: Family Medicine | Admitting: Family Medicine

## 2015-12-18 ENCOUNTER — Other Ambulatory Visit: Payer: Self-pay | Admitting: Family Medicine

## 2015-12-18 DIAGNOSIS — W5501XA Bitten by cat, initial encounter: Secondary | ICD-10-CM

## 2015-12-18 DIAGNOSIS — M79644 Pain in right finger(s): Secondary | ICD-10-CM | POA: Diagnosis present

## 2016-01-09 ENCOUNTER — Other Ambulatory Visit: Payer: Self-pay | Admitting: Nurse Practitioner

## 2016-01-09 DIAGNOSIS — R1011 Right upper quadrant pain: Secondary | ICD-10-CM

## 2016-01-10 ENCOUNTER — Ambulatory Visit
Admission: RE | Admit: 2016-01-10 | Discharge: 2016-01-10 | Disposition: A | Discharge status: Home / Self Care | Payer: Managed Care, Other (non HMO) | Source: Ambulatory Visit | Attending: Nurse Practitioner | Admitting: Nurse Practitioner

## 2016-01-10 DIAGNOSIS — R1011 Right upper quadrant pain: Secondary | ICD-10-CM | POA: Insufficient documentation

## 2016-01-19 ENCOUNTER — Encounter (HOSPITAL_COMMUNITY): Payer: Self-pay

## 2016-01-19 ENCOUNTER — Emergency Department (HOSPITAL_COMMUNITY)
Admission: EM | Admit: 2016-01-19 | Discharge: 2016-01-20 | Disposition: A | Discharge status: Home / Self Care | Payer: Managed Care, Other (non HMO) | Attending: Emergency Medicine | Admitting: Emergency Medicine

## 2016-01-19 ENCOUNTER — Emergency Department (HOSPITAL_COMMUNITY): Payer: Managed Care, Other (non HMO)

## 2016-01-19 DIAGNOSIS — R1011 Right upper quadrant pain: Secondary | ICD-10-CM | POA: Diagnosis present

## 2016-01-19 DIAGNOSIS — R109 Unspecified abdominal pain: Secondary | ICD-10-CM

## 2016-01-19 LAB — COMPREHENSIVE METABOLIC PANEL
ALBUMIN: 4.1 g/dL (ref 3.5–5.0)
ALK PHOS: 57 U/L (ref 38–126)
ALT: 13 U/L — AB (ref 14–54)
ANION GAP: 8 (ref 5–15)
AST: 20 U/L (ref 15–41)
BILIRUBIN TOTAL: 0.5 mg/dL (ref 0.3–1.2)
BUN: 8 mg/dL (ref 6–20)
CALCIUM: 9.2 mg/dL (ref 8.9–10.3)
CO2: 26 mmol/L (ref 22–32)
CREATININE: 0.75 mg/dL (ref 0.44–1.00)
Chloride: 105 mmol/L (ref 101–111)
GFR calc non Af Amer: >60 mL/min (ref >60)
GLUCOSE: 83 mg/dL (ref 65–99)
Potassium: 3.7 mmol/L (ref 3.5–5.1)
Sodium: 139 mmol/L (ref 135–145)
TOTAL PROTEIN: 6.6 g/dL (ref 6.5–8.1)

## 2016-01-19 LAB — URINALYSIS, ROUTINE W REFLEX MICROSCOPIC
BILIRUBIN URINE: NEGATIVE
Glucose, UA: NEGATIVE mg/dL
HGB URINE DIPSTICK: NEGATIVE
KETONES UR: NEGATIVE mg/dL
Leukocytes, UA: NEGATIVE
NITRITE: NEGATIVE
PROTEIN: NEGATIVE mg/dL
SPECIFIC GRAVITY, URINE: 1.015 (ref 1.005–1.030)
pH: 7 (ref 5.0–8.0)

## 2016-01-19 LAB — I-STAT BETA HCG BLOOD, ED (MC, WL, AP ONLY)

## 2016-01-19 LAB — CBC
HCT: 39.2 % (ref 36.0–46.0)
HEMOGLOBIN: 13.4 g/dL (ref 12.0–15.0)
MCH: 28.8 pg (ref 26.0–34.0)
MCHC: 34.2 g/dL (ref 30.0–36.0)
MCV: 84.3 fL (ref 78.0–100.0)
PLATELETS: 524 10*3/uL — AB (ref 150–400)
RBC: 4.65 MIL/uL (ref 3.87–5.11)
RDW: 13.1 % (ref 11.5–15.5)
WBC: 9.5 10*3/uL (ref 4.0–10.5)

## 2016-01-19 LAB — LIPASE, BLOOD: Lipase: 28 U/L (ref 11–51)

## 2016-01-19 MED ORDER — IOPAMIDOL (ISOVUE-300) INJECTION 61%
INTRAVENOUS | Status: AC
  Administered 2016-01-19: 100 mL
  Filled 2016-01-19: qty 100

## 2016-01-19 MED ORDER — HYDROMORPHONE HCL 2 MG/ML IJ SOLN
0.5000 mg | Freq: Once | INTRAMUSCULAR | Status: AC
  Administered 2016-01-19: 0.5 mg via INTRAVENOUS
  Filled 2016-01-19: qty 1

## 2016-01-19 NOTE — ED Triage Notes (Signed)
Patient complains of RUQ pain x 2-3 days.  [facility] and had CBC drawn that she reports was normal. Was told to follow up she developed fever greater than 100. Reports that she has had temp the past 2 days of greater than 100. Nausea with same

## 2016-01-19 NOTE — ED Notes (Signed)
Patient transported to CT 

## 2016-01-19 NOTE — ED Provider Notes (Signed)
[facility]-EMERGENCY DEPT Provider Note   CSN: [id number] Arrival date & time: 01/19/16  1907     History   Chief Complaint Chief Complaint  Patient presents with  . RUQ pain    HPI Robyn Wright is a 20 y.o. female.  RUQ pain for a few days. 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     Abdominal Pain   This is a new problem. The current episode started more than 2 days ago. The problem occurs constantly. The problem has not changed since onset.The pain is associated with a previous surgery. The pain is located in the RUQ. The pain is mild. Associated symptoms include anorexia and fever. Pertinent negatives include diarrhea, nausea, vomiting, constipation, dysuria and headaches. Nothing aggravates the symptoms. Nothing relieves the symptoms. Past workup includes ultrasound. Her past medical history is significant for ulcerative colitis.    Past Medical History:  Diagnosis Date  . GERD (gastroesophageal reflux disease)   . Ulcerative colitis (HCC)     There are no active problems to display for this patient.   Past Surgical History:  Procedure Laterality Date  . SPLENECTOMY    . WISDOM TOOTH EXTRACTION      OB History    Gravida Para Term Preterm AB Living   0 0 0 0 0 0   SAB TAB Ectopic Multiple Live Births   0 0 0 0         Home Medications    Prior to Admission medications   Medication Sig Start Date End Date Taking? Authorizing Provider  Adalimumab (HUMIRA) 40 MG/0.8ML PSKT Inject 40 mg into the skin every 14 (fourteen) days. Next dose due 3 days from today (01-19-16)   Yes Historical Provider, MD  sertraline (ZOLOFT) 100 MG tablet Take 100 mg by mouth daily.   Yes Historical Provider, MD    Family History Family History  Problem Relation Age of Onset  . Hypertension Mother   . Hashimoto's thyroiditis Father     Social History Social History  Substance Use Topics  . Smoking status: Never Smoker  . Smokeless tobacco: Never  Used  . Alcohol use No     Allergies   Review of patient's allergies indicates no known allergies.   Review of Systems Review of Systems  Constitutional: Positive for appetite change and fever.  Gastrointestinal: Positive for abdominal pain and anorexia. Negative for constipation, diarrhea, nausea and vomiting.  Genitourinary: Negative for dysuria.  Neurological: Negative for headaches.  All other systems reviewed and are negative.    Physical Exam Updated Vital Signs BP 114/62   Pulse 90   Temp 98.6 F (37 C) (Oral)   Resp 15   Ht 5\' 5"  (1.651 m)   Wt 152 lb (68.9 kg)   SpO2 100%   BMI 25.29 kg/m   Physical Exam  Constitutional: She is oriented to person, place, and time. She appears well-developed and well-nourished.  HENT:  Head: Normocephalic and atraumatic.  Eyes: Conjunctivae and EOM are normal.  Neck: Normal range of motion.  Cardiovascular: Normal rate and regular rhythm.   Pulmonary/Chest: Effort normal and breath sounds normal. No stridor. No respiratory distress.  Abdominal: She exhibits no distension and no mass. There is tenderness (RUQ).  Neurological: She is alert and oriented to person, place, and time.  Skin: Skin is warm and dry.  Nursing note and vitals reviewed.    ED Treatments / Results  Labs (all labs ordered are listed, but only abnormal  results are displayed) Labs Reviewed  COMPREHENSIVE METABOLIC PANEL - Abnormal; Notable for the following:       Result Value   ALT 13 (*)    All other components within normal limits  CBC - Abnormal; Notable for the following:    Platelets 524 (*)    All other components within normal limits  LIPASE, BLOOD  URINALYSIS, ROUTINE W REFLEX MICROSCOPIC (NOT AT [facility])  I-STAT BETA HCG BLOOD, ED ([facility], [facility], [facility] ONLY)    EKG  EKG Interpretation None       Radiology Ct Abdomen Pelvis W Contrast  Result Date: 01/20/2016 CLINICAL DATA:  RIGHT-sided abdominal pain and nausea. History of liver abscess,  splenectomy, ulcerative colitis. EXAM: CT ABDOMEN AND PELVIS WITH CONTRAST TECHNIQUE: Multidetector CT imaging of the abdomen and pelvis was performed using the standard protocol following bolus administration of intravenous contrast. CONTRAST:  100mL ISOVUE-300 IOPAMIDOL (ISOVUE-300) INJECTION 61% COMPARISON:  RIGHT upper quadrant ultrasound January 10, 2016 FINDINGS: LOWER CHEST: Lung bases are clear. Included heart size is normal. No pericardial effusion. HEPATOBILIARY: Liver and gallbladder are normal. PANCREAS: Normal. SPLEEN: Surgically absent.  LEFT upper quadrant splenule. ADRENALS/URINARY TRACT: Kidneys are orthotopic, demonstrating symmetric enhancement. No nephrolithiasis, hydronephrosis or solid renal masses. The unopacified ureters are normal in course and caliber. Urinary bladder is partially distended and unremarkable. Normal adrenal glands. STOMACH/BOWEL: The stomach, small and large bowel are normal in course and caliber without inflammatory changes. Normal appendix. VASCULAR/LYMPHATIC: Aortoiliac vessels are normal in course and caliber. No lymphadenopathy by CT size criteria. REPRODUCTIVE: Normal. OTHER: No intraperitoneal free fluid or free air. MUSCULOSKELETAL: Nonacute. Anterior abdominal wall scarring. Mild lower lumbar levoscoliosis may be positional. IMPRESSION: No acute intra-abdominal or pelvic process. No abscess. Normal appendix. Status post splenectomy. Electronically Signed   By: Awilda Metroourtnay  Bloomer M.D.   On: 01/20/2016 00:38    Procedures Procedures (including critical care time)  Medications Ordered in ED Medications  HYDROmorphone (DILAUDID) injection 0.5 mg (0.5 mg Intravenous Given 01/19/16 2232)  iopamidol (ISOVUE-300) 61 % injection (100 mLs  Contrast Given 01/19/16 2238)     Initial Impression / Assessment and Plan / ED Course  I have reviewed the triage vital signs and the nursing notes.  Pertinent labs & imaging results that were available during my care of the  patient were reviewed by me and considered in my medical decision making (see chart for details).  Clinical Course    Will CT scan for recurrent abscess, UC flare, gall bladder issues.  Ct negative. Pain improved. Patient frustrated I did not find the answer to her symptoms, However I explained that I had ruled out many of the life-threatening issues and patient could always return with new or worsening symptoms.  Final Clinical Impressions(s) / ED Diagnoses   Final diagnoses:  Abdominal pain, unspecified abdominal location    New Prescriptions New Prescriptions   No medications on file     Marily MemosJason Zaul Hubers, MD 01/20/16 0100

## 2016-01-20 NOTE — ED Notes (Signed)
Pt understood dc material. NAD noted. 

## 2016-03-02 ENCOUNTER — Encounter (HOSPITAL_BASED_OUTPATIENT_CLINIC_OR_DEPARTMENT_OTHER): Payer: Self-pay

## 2016-03-02 NOTE — Progress Notes (Signed)
[facility] Partial Hospitalization Program (PHP) Referral     Date/Time: 03/02/2016, 9:38 AM   Interviewer: Teodoro Kil  Patient Name: Cheryl Mcneil   DOB: 10/07/1995  SSN: [id number]  Gender: female    Patient Address:  [address]  [place] [address]    Patient Phone Number(s):  [phone] (home)  ,   Emergency Contact Name /Relationship: Rogina Schiano / Father  Emergency Contact Phone Number: [phone]   Can we speak to this person regarding your admission to this treatment episode?  yes    Caller relationship: Patient, Caller phone:[phone]   Identify if possible:   Referring Provider Name: [facility]  Phone Number, if available:    Insurance Info:  Company: Social research officer, government [facility]):   [place]   Relationship to PT: Father    PH DOB: 01/11/1967  Member ID: [id number]  Group #: [id number]   Insured Employer: [employer]  Phone number: Providers/Mental Health/SA: [phone]  Insurance Address: [address]  [address] [employer]    Clinical Information  Presenting Problem: Suffering from depression and anxiety since she was 12,She had a relationship and it ended badly which has been the cause of her depression and inability to function with attending school and focus properly on school and work. 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     Precipitating Event: bad breakup in may     Have you attended PHP before? No  Where:    Would you be interested in attending PHP at the [facility] location? No    Drug Use with Additional Information  Any current use of alcohol or drugs? No      History of and last date of:  02/21/2016    Hallucinations? No    Black outs? No  Seizures?  No    Additional drug use info: -      Current / Past Withdrawal Symptoms:    none, at this time    Current Stressors and Issues: school and family issues    Hx of legal issues or current/pending charges and court dates upcoming ?No     Psychosocial stressors exacerbating symptoms? yes  family     Any hx of Suicidal thoughts or plans? Yes a few days ago, "doesn't deserve to be here"    Any hx of Homicidal Thoughts or Plans?  No    Imminent Risk of Suicide and/or Harm to Others?  No    TASR Risk / Protective Factors:   No risk factors         Medical and/or Mental Health Issues:  Any previous hospitalization: No  Current outpatient provider: Yes,DR Samuella Bruin Most recent appointment (past or future): 02/12/2016    Previous diagnoses: depression and anxiety     Any chronic medical issues? Yes irritable bowl syndrome , ulcerative   Any medical issues that may interfere with physical mobility or tx? No    Current Medication (Psychiatric or Medical): Zoloft 100 MG but currently out of meds, humeral 140 MG every other week    Allergies? none    Does the patient require Hard of Hearing Services ? No    Does the patient require Language Services? No  Additional Notes: stated she walked in on her mother and brother having an argument and her mother telling her brother she regretted having him and wish she had an abortion. That incident has been troubling her and giving her more depression and anxiety.     Preliminary Diagnosis:  depression, anxiety  and post-traumatic stress disorder    Recommended Level of Care: PHP    Appointment with: Hyman Bible      Location: [place]              Date: 03/10/2016             Time: 8:30am

## 2016-03-10 ENCOUNTER — Encounter (HOSPITAL_BASED_OUTPATIENT_CLINIC_OR_DEPARTMENT_OTHER): Payer: Commercial Managed Care - PPO | Admitting: Professional

## 2017-03-09 ENCOUNTER — Encounter: Payer: Managed Care, Other (non HMO) | Admitting: Obstetrics and Gynecology

## 2017-04-01 ENCOUNTER — Ambulatory Visit (INDEPENDENT_AMBULATORY_CARE_PROVIDER_SITE_OTHER): Payer: Managed Care, Other (non HMO) | Admitting: Obstetrics and Gynecology

## 2017-04-01 ENCOUNTER — Encounter: Payer: Self-pay | Admitting: Obstetrics and Gynecology

## 2017-04-01 VITALS — BP 108/58 | HR 66 | Ht 65.0 in | Wt 141.0 lb

## 2017-04-01 DIAGNOSIS — Z3046 Encounter for surveillance of implantable subdermal contraceptive: Secondary | ICD-10-CM | POA: Diagnosis not present

## 2017-04-01 DIAGNOSIS — Z30017 Encounter for initial prescription of implantable subdermal contraceptive: Secondary | ICD-10-CM

## 2017-04-01 NOTE — Progress Notes (Signed)
     GYNECOLOGY OFFICE PROCEDURE NOTE  Murray HodgkinsVictoria J Beazer is a 22 y.o. G0P0000 here for Nexplanon removal with new Nexplanon insertion.  Patient has never had a pap smear.  No other gynecologic concerns.   Nexplanon Removal and Insertion  Patient identified, informed consent performed, consent signed.   Patient does understand that irregular bleeding is a very common side effect of this medication. She was advised to have backup contraception for one week after replacement of the implant. Pregnancy test in clinic today was negative.  Appropriate time out taken. Implanon site identified. Area prepped in usual sterile fashon. One ml of 1% lidocaine was used to anesthetize the area at the distal end of the implant. A small stab incision was made right beside the implant on the distal portion. The Nexplanon rod was grasped using hemostats and removed without difficulty. There was minimal blood loss. There were no complications. Area was then injected with 3 ml of 1 % lidocaine. She was re-prepped with betadine, Nexplanon removed from packaging, Device confirmed in needle, then inserted full length of needle and withdrawn per handbook instructions. Nexplanon was able to palpated in the patient's arm; patient palpated the insert herself.  There was minimal blood loss. Patient insertion site covered with guaze and a pressure bandage to reduce any bruising. The patient tolerated the procedure well and was given post procedure instructions.  She was advised to have backup contraception for one week.     To follow up in 1 month for annual exam.    Hildred Laserherry, Itzayanna Kaster, MD [facility]

## 2017-04-01 NOTE — Addendum Note (Signed)
Addended by: Fabian NovemberHERRY, Flynn Gwyn S on: 04/01/2017 03:01 PM   Modules accepted: Level of Service

## 2017-04-01 NOTE — Patient Instructions (Signed)
NEXPLANON PLACEMENT POST-PROCEDURE INSTRUCTIONS  1. You may take Ibuprofen, Aleve or Tylenol for pain if needed.  Pain should resolve within in 24 hours.  2. You may have intercourse after 24 hours.  If you using this for birth control, it is effective immediately.  3. You need to call if you have any fever, heavy bleeding, or redness at insertion site. Irregular bleeding is common the first several months after having a Nexplanonplaced. You do not need to call for this reason unless you are concerned.  4. Shower or bathe as normal.  You can remove the bandage after 24 hours. 5.   

## 2017-05-07 ENCOUNTER — Encounter: Payer: Managed Care, Other (non HMO) | Admitting: Obstetrics and Gynecology

## 2017-05-26 ENCOUNTER — Encounter: Payer: Managed Care, Other (non HMO) | Admitting: Obstetrics and Gynecology

## 2017-06-10 IMAGING — US US ABDOMEN LIMITED
1 series · 14 of 25 positions shown · non-contrast
Comparison: None.

CLINICAL DATA: Right upper quadrant abdominal pain and nausea over
the past 3 days.

EXAM:
US ABDOMEN LIMITED - RIGHT UPPER QUADRANT

[Series 1: us abdomen limited · 0.22mm/px · 14 of 50 slices shown]
[im 1/50]
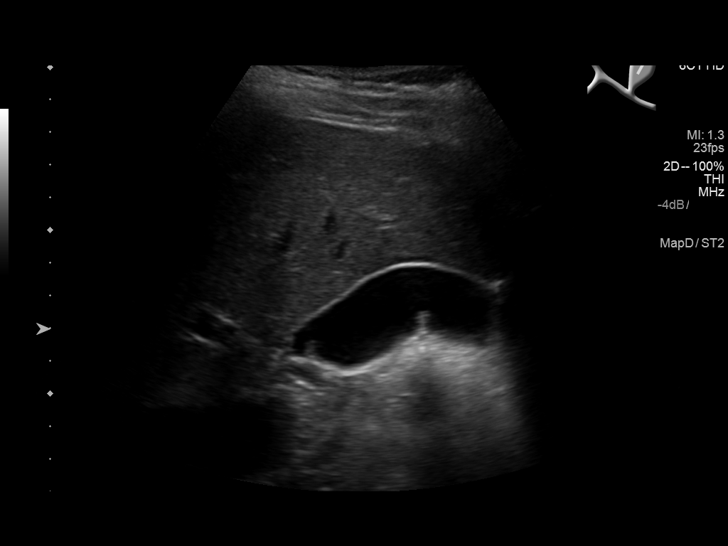
[im 5/50]
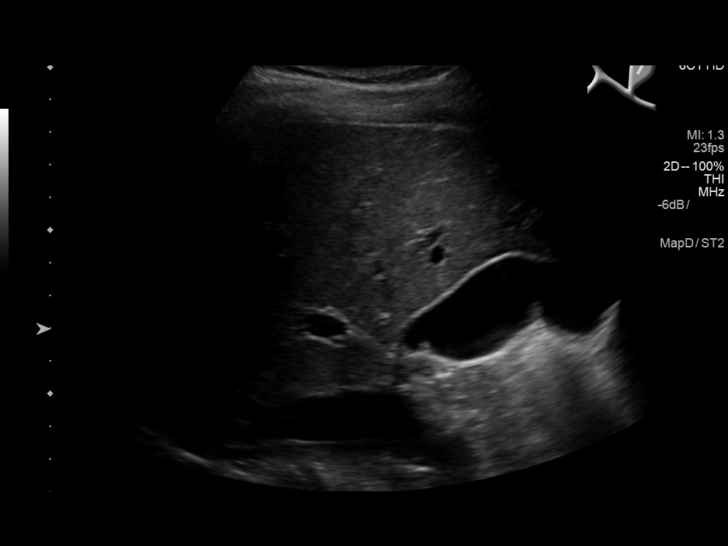
[im 9/50]
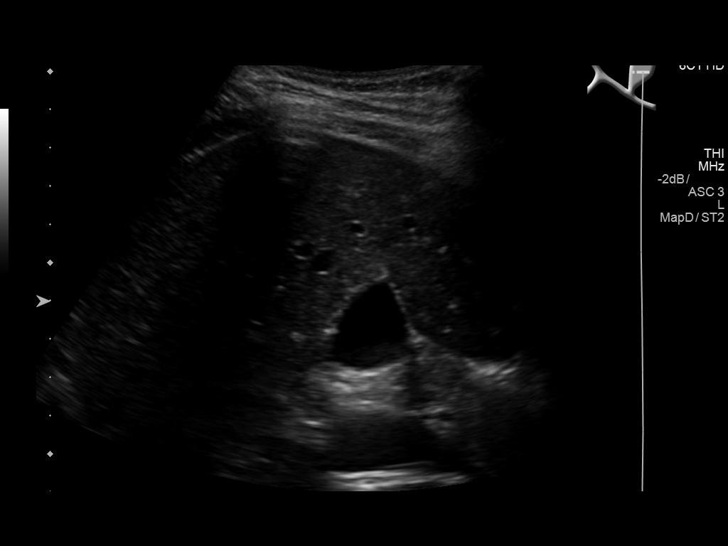
[im 13/50]
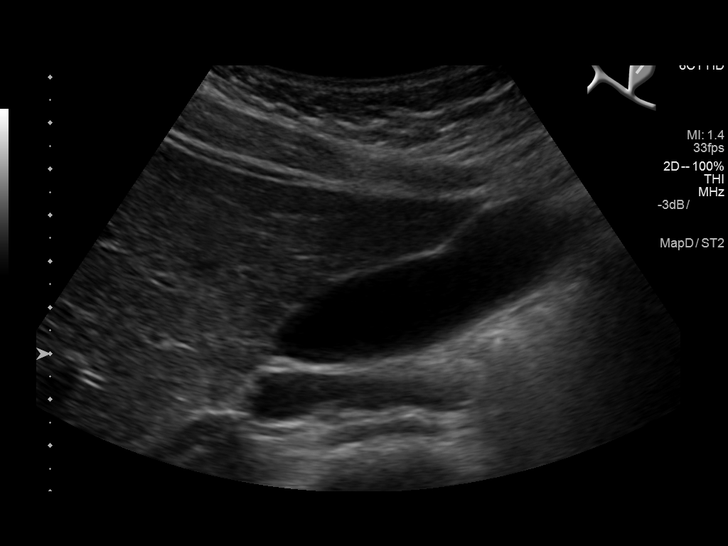
[im 17/50]
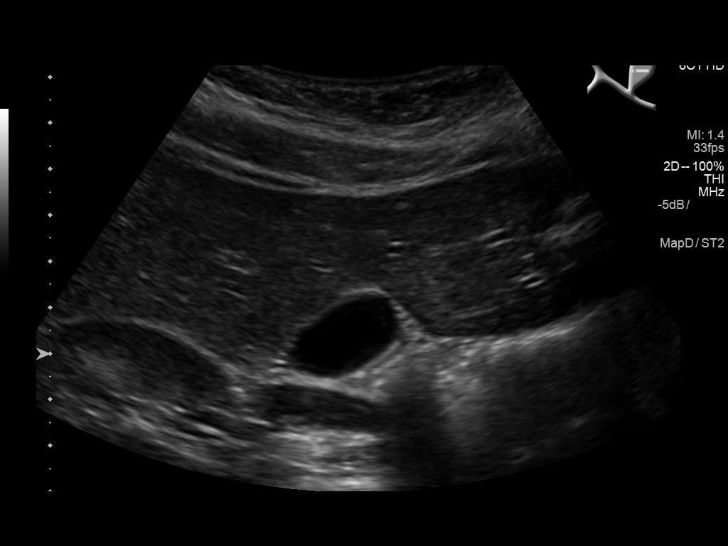
[im 19/50]
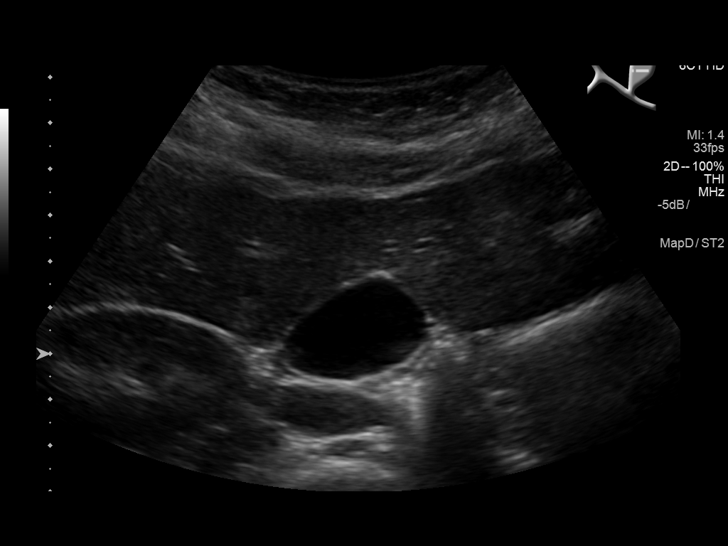
[im 23/50]
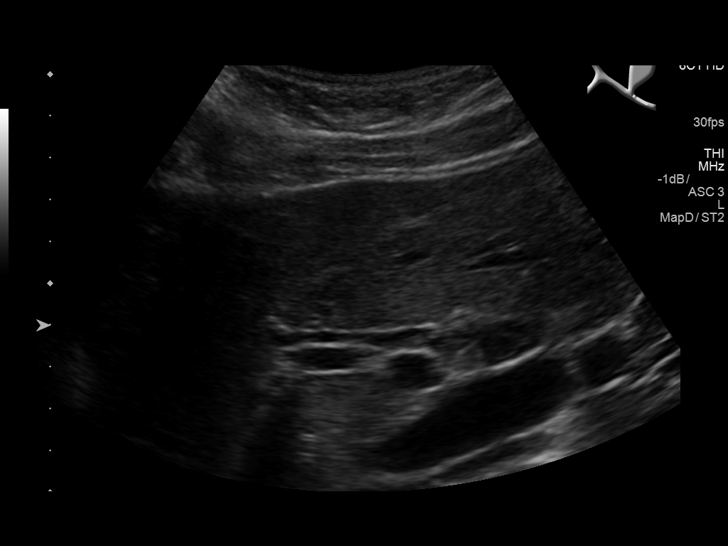
[im 27/50]
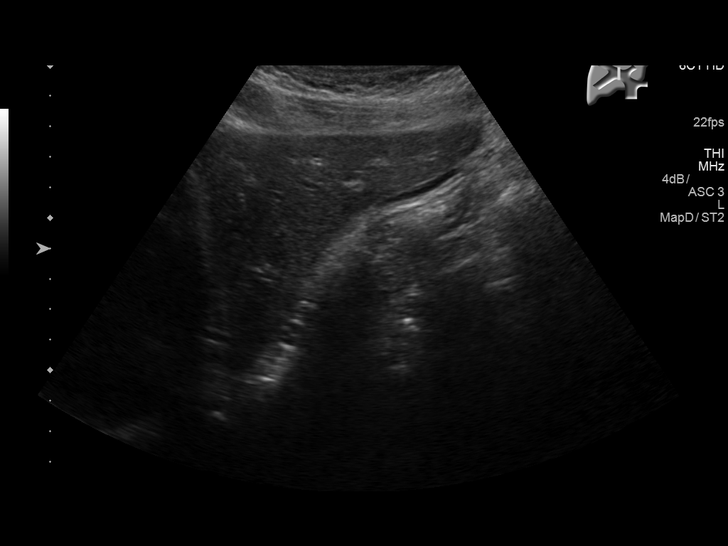
[im 31/50]
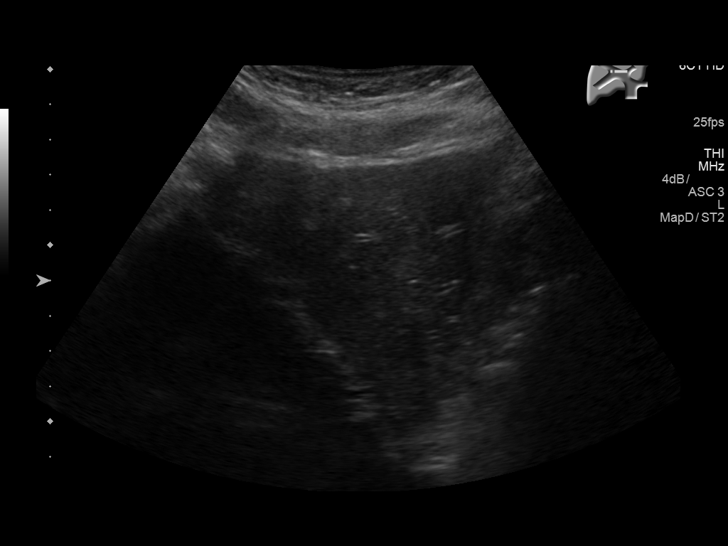
[im 33/50]
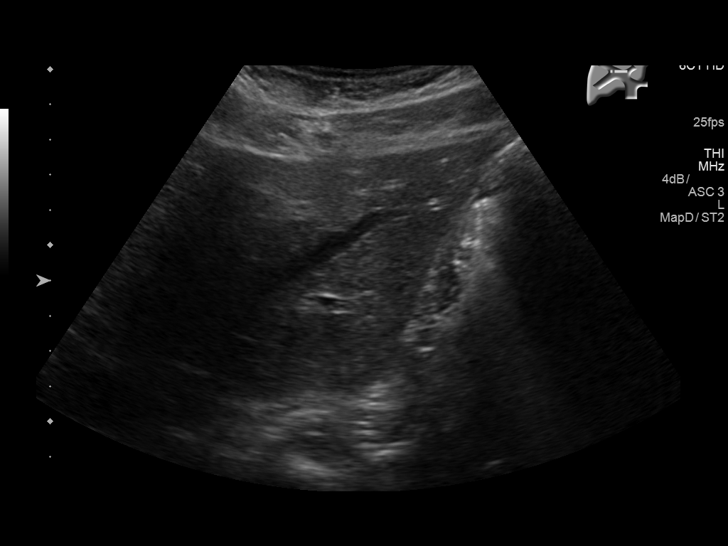
[im 37/50]
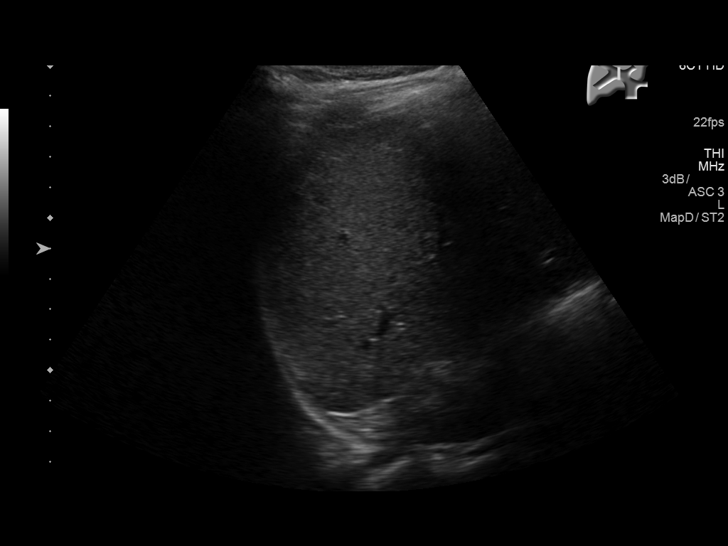
[im 41/50]
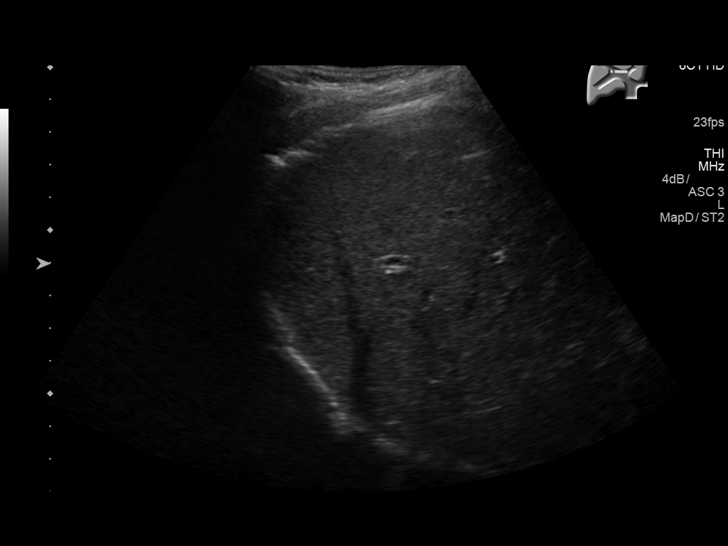
[im 45/50]
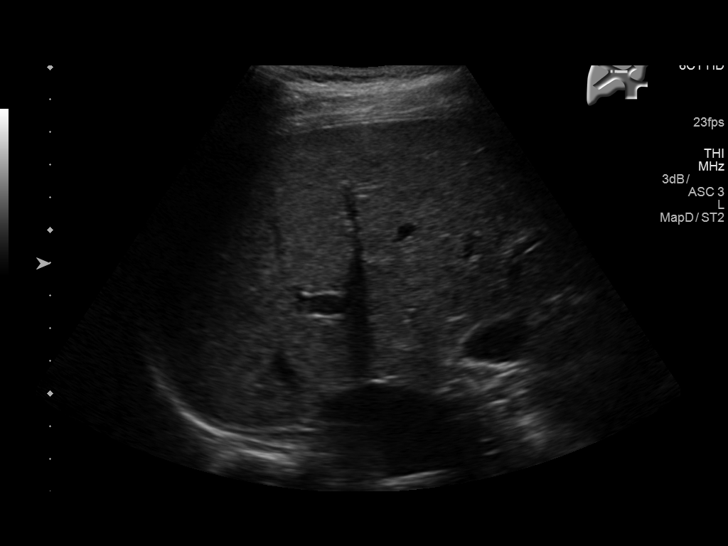
[im 50/50]
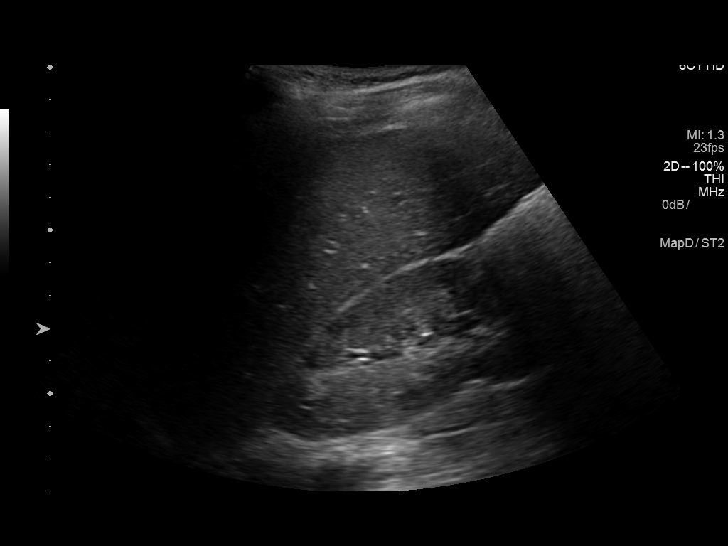

[14 of 25 positions shown; findings below may reference images not displayed]

FINDINGS: Gallbladder:

No gallstones or wall thickening visualized. No sonographic Murphy
sign noted by sonographer.

Common bile duct:

Diameter: 4 mm

Liver:

No focal lesion identified. Within normal limits in parenchymal
echogenicity.
IMPRESSION: 1. Normal sonographic appearance of the hepatobiliary system.

## 2017-08-03 ENCOUNTER — Encounter: Payer: Self-pay | Admitting: Sports Medicine

## 2017-08-03 ENCOUNTER — Ambulatory Visit (INDEPENDENT_AMBULATORY_CARE_PROVIDER_SITE_OTHER): Payer: Managed Care, Other (non HMO) | Admitting: Sports Medicine

## 2017-08-03 VITALS — BP 90/64 | Ht 65.0 in | Wt 136.0 lb

## 2017-08-03 DIAGNOSIS — S060X0A Concussion without loss of consciousness, initial encounter: Secondary | ICD-10-CM

## 2017-08-03 NOTE — Progress Notes (Signed)
   Subjective:    Patient ID: Robyn Wright, female    DOB: 1995-11-01, 22 y.o.   MRN: [id number]  HPI complaint: Concussion  22 year old female comes in today for clearance after a recent concussion. She is a dance major at [employer] and she suffered a concussion 4 weeks ago when she hit her head. She denies loss of consciousness. Main symptoms were headache and light sensitivity. She did miss one day of school. She was initially held out of dance class until recently. Her symptoms completely resolved about a week ago. She was allowed to return to dance and has slowly been progressing back to full activity. She is now back to full activity without any returning symptoms. She's had two prior concussions. Last concussion was a year ago and it was also related to dance. Today, she remains completely asymptomatic and is anxious to return to dance.  Past medical history reviewed Medications reviewed Allergies reviewed    Review of Systems As above    Objective:   Physical Exam  Well-developed, well-nourished. No acute distress. Awake alert nor and at 3. Vital signs reviewed  Neurological exam: Cranial nerves II through XII grossly intact. Strength grossly intact. No nystagmus. Normal horizontal and vertical saccades. Balance is excellent.  IMPACT testing shows the patient to be nearly back to baseline.       Assessment & Plan:   Resolved concussion  Patient is cleared to return to all activity including dance. She is asymptomatic and has been completely asymptomatic for one week. She has had a gradual return to activity without returning symptoms. She will follow-up with me as needed.

## 2019-08-09 ENCOUNTER — Encounter (INDEPENDENT_AMBULATORY_CARE_PROVIDER_SITE_OTHER): Payer: Self-pay | Admitting: Physician Assistant

## 2019-08-16 ENCOUNTER — Encounter (INDEPENDENT_AMBULATORY_CARE_PROVIDER_SITE_OTHER): Payer: Self-pay | Admitting: Foot & Ankle Surgery

## 2019-08-16 ENCOUNTER — Ambulatory Visit (INDEPENDENT_AMBULATORY_CARE_PROVIDER_SITE_OTHER): Payer: Commercial Managed Care - PPO | Admitting: Foot & Ankle Surgery

## 2019-08-16 ENCOUNTER — Ambulatory Visit (INDEPENDENT_AMBULATORY_CARE_PROVIDER_SITE_OTHER): Payer: Commercial Managed Care - PPO

## 2019-08-16 VITALS — BP 105/73 | HR 72 | Wt 155.0 lb

## 2019-08-16 DIAGNOSIS — M7752 Other enthesopathy of left foot: Secondary | ICD-10-CM

## 2019-08-16 DIAGNOSIS — M79672 Pain in left foot: Secondary | ICD-10-CM

## 2019-08-16 NOTE — Progress Notes (Signed)
Podiatric Surgery Patient Visit    Patient Name: Cheryl Mcneil,Cheryl Mcneil  Assessment:   -Patient is 24 y.o. 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  No significant abnormal findings apart from a bone spur on the neck of the talus which could be causing some impingement  -Discussed diagnosis and treatment options  -Recommend immobilization in a cam boot.  Weightbearing as tolerated.  Rest, ice, elevation.  Rest from dance for now  -Referral to PT provided to patient-recommend she start in about 2 weeks when her pain is improved.  -We briefly discussed possible steroid injection around the bone spur if this area continues to cause pain, also discussed surgical excision in the future if pain worsens.  -F/U 6 weeks for reevaluation.  Would order MRI if patient has continued pain.    ----------------------------------------------------------------------------------------------------------------------------    Risk of Problem/Management: Low (99203/99213)      Subjective:     Chief Complaint:   Chief Complaint   Patient presents with    Foot Pain     left foot pain        HPI:  Cheryl Mcneil is a 24 y.o. female.  Patient presents with chief complaint of acute onset worsening left foot and ankle pain.  She feels like it is an overuse injury.  She is a Horticulturist, commercial, is currently pursuing a graduate degree in dance.  She has pain radiating from the top of her big toe across the top of her foot and over the ankle.  She thinks it may be a tendon issue.  She has tried resting for the last 3 weeks with no improvement of her pain.  She also has some pain occasionally over the anterior ankle.  Denies any significant injury or  trauma.      Denies N/V/F/C/SOB/CP  All other systems were reviewed and are negative  The following portions of the patient's history were reviewed and updated as appropriate: allergies, current medications, past medical history, past surgical history, family history, and problem list.    Physical Exam:     Vitals:    08/16/19 1111   BP: 105/73   Pulse: 72   SpO2: 98%       Gen: AAOx3, NAD, Well developed  HEENT: Normocephalic, EOMI  Heart: Regular pulse rate and rhythm  Vascular:  Palpable pedal pulses b/l, CFT to all toes <3 sec. No edema. No varicosities  Derm:  Skin intact without wounds or rashes.  No erythema or ecchymosis.  MSK:   There is tenderness along the course of the extensor hallucis longus tendon.  No palpable rupture or tear.  No abnormal thickening to the tendon.  There is mild tenderness of the anterior medial ankle joint line where there is palpable bony spurring over the neck of the talus.  MMS 5/5 and symmetric, No other areas of pain or tenderness. No gross deformity. ROM normal without crepitus.  Neuro:  SILT. Neg tinel's sign.  Able to wiggle toes. No motor deficits.       Standing and gait exam: Gait is nonantalgic.  Normal gait.      Radiology:     Xrays taken in clinic today: X-ray imaging of the left foot  was obtained and 3 weightbearing views: There is dorsal bony spurring at the neck of the talus.  No acute fracture or dislocation.  No degenerative changes.  No deformity or other acute abnormality.        Claudette Head, DPM FACFAS  [facility]  Pager [phone]    ------------------------------------------------------------------------------------------------------------------------------  The review of the patient's medications does not in any way constitute an endorsement, by this clinician,  of their use, dosage, indications, route, efficacy, interactions, or other clinical parameters.    This note was generated within the EPIC EMR using Dragon medical speech  recognition software and may contain inherent errors or omissions not intended by the user. Grammatical and punctuation errors, random word insertions, deletions, pronoun errors and incomplete sentences are occasional consequences of this technology due to software limitations. Not all errors are caught or corrected.  Although every attempt is made to root out erroneus and incomplete transcription, the note may still not fully represent the intent or opinion of the author. If there are questions or concerns about the content of this note or information contained within the body of this dictation they should be addressed directly with the author for clarification.

## 2019-09-26 ENCOUNTER — Encounter (INDEPENDENT_AMBULATORY_CARE_PROVIDER_SITE_OTHER): Payer: Self-pay | Admitting: Foot & Ankle Surgery

## 2019-09-27 ENCOUNTER — Ambulatory Visit (INDEPENDENT_AMBULATORY_CARE_PROVIDER_SITE_OTHER): Payer: Commercial Managed Care - PPO | Admitting: Foot & Ankle Surgery

## 2019-10-03 ENCOUNTER — Ambulatory Visit (INDEPENDENT_AMBULATORY_CARE_PROVIDER_SITE_OTHER): Payer: Commercial Managed Care - PPO | Admitting: Foot & Ankle Surgery

## 2019-10-10 ENCOUNTER — Encounter (INDEPENDENT_AMBULATORY_CARE_PROVIDER_SITE_OTHER): Payer: Self-pay | Admitting: Foot & Ankle Surgery

## 2019-10-10 ENCOUNTER — Ambulatory Visit (INDEPENDENT_AMBULATORY_CARE_PROVIDER_SITE_OTHER): Payer: Commercial Managed Care - PPO | Admitting: Foot & Ankle Surgery

## 2019-10-10 VITALS — BP 114/75 | HR 72 | Wt 155.0 lb

## 2019-10-10 DIAGNOSIS — S99912A Unspecified injury of left ankle, initial encounter: Secondary | ICD-10-CM

## 2019-10-10 DIAGNOSIS — S82832A Other fracture of upper and lower end of left fibula, initial encounter for closed fracture: Secondary | ICD-10-CM

## 2019-10-10 NOTE — Progress Notes (Signed)
Podiatric Surgery Patient Visit    Patient Name: Cheryl Mcneil,Cheryl Mcneil  Assessment:   -Patient is 24 y.o. 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  No report was available.  I personally interpreted the images.  There does appear to be a possible small hairline nondisplaced fracture to the distal tip of the fibula.  No displacement noted.  -Patient can continue the boot and wean the boot as her pain improves.  To be weightbearing as tolerated.  She is already been in the boot for 2 weeks with much improvement.  -Recommend rest, ice, elevation  -She can go back to dance and take it easy as long as she is not having pain.  Do not recommend any jumping or running at this time.    -X-ray imaging of the left foot and ankle reviewed with patient and discussed.  No significant abnormal findings apart from a bone spur on the neck of the talus which could be causing some impingement    -F/U 6 weeks for reevaluation.  Do not need repeat x-rays at that visit    ----------------------------------------------------------------------------------------------------------------------------    Risk of Problem/Management: Moderate (99204/99214)      Subjective:     Chief Complaint:   Chief Complaint   Patient presents with    Foot Pain     left foot possible fx from urgent care    Ankle Pain     Tendinitis of left ankle        HPI:  Cheryl Mcneil is a 24 y.o. female.  Patient presents today for new issue.  She injured her left foot and ankle about 2 weeks ago.  She is previously seen me for tendinitis and impingement which is much improved  since she was treated.  [facility] and x-rays were done of her left foot and left ankle.  She was told she may have a small fracture of her ankle.  She has been in the boot for 2 weeks and feels much better.  Swelling and bruising has significantly improved.  She like to go back to dancing.  She is a Horticulturist, commercial.      HPI from last visit May 2021:  Patient presents with chief complaint of acute onset worsening left foot and ankle pain.  She feels like it is an overuse injury.  She is a Horticulturist, commercial, is currently pursuing a graduate degree in dance.  She has pain radiating from the top of her big toe across the top of her foot and over the ankle.  She thinks it may be a tendon issue.  She has tried resting for the last 3 weeks with no improvement of her pain.  She also has some pain occasionally over the anterior ankle.  Denies any significant injury or trauma.      Denies N/V/F/C/SOB/CP  All other systems were reviewed and are negative  The following portions of the patient's history were reviewed and updated as appropriate: allergies, current medications, past medical history, past surgical history, family history, and problem list.    Physical Exam:  Vitals:    10/10/19 1145   BP: 114/75   Pulse: 72       Gen: AAOx3, NAD, Well developed  HEENT: Normocephalic, EOMI  Heart: Regular pulse rate and rhythm  Vascular:  Palpable pedal pulses b/l, CFT to all toes <3 sec. No edema. No varicosities  Derm:  Skin intact without wounds or rashes.  No erythema or ecchymosis.  MSK: Mild tenderness to the distal tip of the fibula.  Lateral ankle ligaments are minimally tender.  No gross laxity.  No pain with inversion stress.  MMS 5/5 and symmetric, No other areas of pain or tenderness. No gross deformity. ROM normal without crepitus.  Neuro:  SILT. Neg tinel's sign.  Able to wiggle toes. No motor deficits.       Standing and gait exam: Gait is nonantalgic.  Normal gait.      Radiology:     Xrays taken in  clinic today: None    Extremity none [facility]-CT was available-x-rays of the left foot and left ankle were reviewed.  Imaging is normal apart from a small vertical hairline fracture through the distal tip of the fibula which is nondisplaced.      Next imaging done at last visit-reviewed-see below  x-ray imaging of the left foot was obtained and 3 weightbearing views: There is dorsal bony spurring at the neck of the talus.  No acute fracture or dislocation.  No degenerative changes.  No deformity or other acute abnormality.        Claudette Head, DPM FACFAS  [facility]  Pager [phone]    ------------------------------------------------------------------------------------------------------------------------------  The review of the patient's medications does not in any way constitute an endorsement, by this clinician,  of their use, dosage, indications, route, efficacy, interactions, or other clinical parameters.    This note was generated within the EPIC EMR using Dragon medical speech recognition software and may contain inherent errors or omissions not intended by the user. Grammatical and punctuation errors, random word insertions, deletions, pronoun errors and incomplete sentences are occasional consequences of this technology due to software limitations. Not all errors are caught or corrected.  Although every attempt is made to root out erroneus and incomplete transcription, the note may still not fully represent the intent or opinion of the author. If there are questions or concerns about the content of this note or information contained within the body of this dictation they should be addressed directly with the author for clarification.

## 2021-03-12 ENCOUNTER — Encounter (INDEPENDENT_AMBULATORY_CARE_PROVIDER_SITE_OTHER): Payer: Self-pay | Admitting: Family Medicine

## 2021-03-12 ENCOUNTER — Telehealth (INDEPENDENT_AMBULATORY_CARE_PROVIDER_SITE_OTHER): Payer: Commercial Managed Care - PPO | Admitting: Family Medicine

## 2021-03-12 DIAGNOSIS — M5442 Lumbago with sciatica, left side: Secondary | ICD-10-CM

## 2021-03-12 MED ORDER — CYCLOBENZAPRINE HCL 10 MG PO TABS
10.00 mg | ORAL_TABLET | Freq: Three times a day (TID) | ORAL | 2 refills | Status: AC | PRN
Start: 2021-03-12 — End: 2021-04-11

## 2021-03-12 MED ORDER — OXYCODONE-ACETAMINOPHEN 5-325 MG PO TABS
1.00 | ORAL_TABLET | Freq: Four times a day (QID) | ORAL | 0 refills | Status: AC | PRN
Start: 2021-03-12 — End: 2021-03-19

## 2021-03-12 NOTE — Progress Notes (Signed)
[facility]    Subjective:      Date: 03/12/2021 1:01 PM   Patient ID: Cheryl Mcneil is a 25 y.o. female.    Verbal consent has been obtained from the patient to conduct a video and telephone visit: yes    Chief Complaint:  Chief Complaint   Patient presents with    Back Pain       HPI:  Back Pain  This is a new problem. The problem is unchanged. The pain is present in the lumbar spine. The quality of the pain is described as shooting. The pain radiates to the left thigh. The pain is at a severity of 9/10. The pain is severe. Pertinent negatives include no abdominal pain, chest pain, fever, headaches, numbness or weakness.          Problem List:  Patient Active Problem List   Diagnosis    UC (ulcerative colitis)    Pain in joint, pain in unspecified joint    Knee pain, unspecified laterality    H/O endoscopy       Current Medications:  Current Outpatient Medications   Medication Sig Dispense Refill    Adalimumab (HUMIRA SC) Inject into the skin.      b complex vitamins tablet Take 1 tablet by mouth daily.      cyclobenzaprine (FLEXERIL) 10 MG tablet Take 1 tablet (10 mg) by mouth every 8 (eight) hours as needed for Muscle spasms 30 tablet 2    ferrous sulfate 220 (44 FE) MG/5ML solution Take 220 mg by mouth daily.      metoclopramide (REGLAN) 5 MG tablet Take 1 tablet (5 mg total) by mouth 4 (four) times daily. 20 tablet 0    Multiple Vitamin (MULTIVITAMIN) tablet Take 1 tablet by mouth daily.      oxyCODONE-acetaminophen (PERCOCET) 5-325 MG per tablet Take 1 tablet by mouth every 6 (six) hours as needed for Pain 28 tablet 0    pantoprazole (PROTONIX) 40 MG tablet Take 40 mg by mouth daily.      sucralfate (CARAFATE) 1 G tablet Take 1 tablet (1 g total) by mouth 4 (four) times daily. 20 tablet 0     No current facility-administered medications for this visit.       Allergies:  Allergies   Allergen Reactions    Other      Tumeric and polllon       Past Medical History:  Past Medical History:    Diagnosis Date    Anxiety     Concussion 2014    Depression     IBS (irritable bowel syndrome)     Seasonal allergies     Ulcerative colitis        Past Surgical History:  Past Surgical History:   Procedure Laterality Date    SPLENECTOMY, TOTAL      WISDOM TOOTH EXTRACTION  07/2012    All 4 tooth       Family History:  Family History   Problem Relation Age of Onset    Heart disease Maternal Uncle         Pace marker    Diabetes Maternal Grandmother     Heart disease Maternal Grandmother     Heart disease Maternal Grandfather     Diabetes Paternal Grandmother     Thyroid disease Paternal Grandmother     Diabetes Paternal Grandfather     Thyroid disease Father        Social History:  Social History  Socioeconomic History    Marital status: Single   Tobacco Use    Smoking status: Never    Smokeless tobacco: Never   Vaping Use    Vaping Use: Never used   Substance and Sexual Activity    Alcohol use: No     Alcohol/week: 0.0 standard drinks     Comment: socially    Drug use: No    Sexual activity: Never        The following sections were reviewed this encounter by the provider:        ROS:  Review of Systems   Constitutional:  Negative for chills and fever.   Respiratory:  Negative for cough, shortness of breath and wheezing.    Cardiovascular:  Negative for chest pain and leg swelling.   Gastrointestinal:  Negative for abdominal pain, diarrhea, nausea and vomiting.   Musculoskeletal:  Positive for back pain.   Neurological:  Negative for weakness, light-headedness, numbness and headaches.      Objective:     Physical Exam:  Physical Exam  Nursing note reviewed.   Constitutional:       Appearance: Normal appearance.   HENT:      Mouth/Throat:      Mouth: Mucous membranes are moist.   Eyes:      Extraocular Movements: Extraocular movements intact.   Pulmonary:      Effort: Pulmonary effort is normal.   Neurological:      Mental Status: She is alert.   Psychiatric:         Mood and Affect: Mood normal.        Assessment/Plan:       1. Acute left-sided low back pain with left-sided sciatica  - Referral to Physical Therapy[facility]  - oxyCODONE-acetaminophen (PERCOCET) 5-325 MG per tablet; Take 1 tablet by mouth every 6 (six) hours as needed for Pain  Dispense: 28 tablet; Refill: 0  - cyclobenzaprine (FLEXERIL) 10 MG tablet; Take 1 tablet (10 mg) by mouth every 8 (eight) hours as needed for Muscle spasms  Dispense: 30 tablet; Refill: 2    Back Pain:  Acute, uncomplicated. Persistent symptoms. Radicular symptoms are present. The differential diagnosis includes lumbar muscle strain, sciatica / radiculopathy, lumbar DDD, spinal stenosis, or herniated disc.Due to intractable nature of pain, recommend starting opioid therapy.  [place] aberrant [place].  Discussed risks of addiction and overdose with opioid therapy.  Discussed side effects of opioid therapy which include constipation, nausea, pruritus, overdose, hypersomnolence, or sweating.            Return if symptoms worsen or fail to improve.    Virgilio Frees, MD

## 2021-03-12 NOTE — Patient Instructions (Signed)
Low Back Pain: Exercises  Introduction  Here are some examples of exercises for you to try. The exercises may be suggested for a condition or for rehabilitation. Start each exercise slowly. Ease off the exercises if you start to have pain.  You will be told when to start these exercises and which ones will work best for you.  How to do the exercises  Press-up    Lie on your stomach, supporting your body with your forearms.  Press your elbows down into the floor to raise your upper back. As you do this, relax your stomach muscles and allow your back to arch without using your back muscles. As your press up, do not let your hips or pelvis come off the floor.  Hold for 15 to 30 seconds, then relax.  Repeat 2 to 4 times.  Alternate arm and leg (bird dog) exercise    Do this exercise slowly. Try to keep your body straight at all times, and do not let one hip drop lower than the other.  Start on the floor, on your hands and knees.  Tighten your belly muscles.  Raise one leg off the floor, and hold it straight out behind you. Be careful not to let your hip drop down, because that will twist your trunk.  Hold for about 6 seconds, then lower your leg and switch to the other leg.  Repeat 8 to 12 times on each leg.  Over time, work up to holding for 10 to 30 seconds each time.  If you feel stable and secure with your leg raised, try raising the opposite arm straight out in front of you at the same time.  Knee-to-chest exercise    Lie on your back with your knees bent and your feet flat on the floor.  Bring one knee to your chest, keeping the other foot flat on the floor (or keeping the other leg straight, whichever feels better on your lower back).  Keep your lower back pressed to the floor. Hold for at least 15 to 30 seconds.  Relax, and lower the knee to the starting position.  Repeat with the other leg. Repeat 2 to 4 times with each leg.  To get more stretch, put your other leg flat on the floor while pulling your knee to  your chest.  Curl-ups    Lie on the floor on your back with your knees bent at a 90-degree angle. Your feet should be flat on the floor, about 12 inches from your buttocks.  Cross your arms over your chest. If this bothers your neck, try putting your hands behind your neck (not your head), with your elbows spread apart.  Slowly tighten your belly muscles and raise your shoulder blades off the floor.  Keep your head in line with your body, and do not press your chin to your chest.  Hold this position for 1 or 2 seconds, then slowly lower yourself back down to the floor.  Repeat 8 to 12 times.  Pelvic tilt exercise    Lie on your back with your knees bent.  "Brace" your stomach. This means to tighten your muscles by pulling in and imagining your belly button moving toward your spine. You should feel like your back is pressing to the floor and your hips and pelvis are rocking back.  Hold for about 6 seconds while you breathe smoothly.  Repeat 8 to 12 times.  Heel dig bridging    Lie on your back with both   knees bent and your ankles bent so that only your heels are digging into the floor. Your knees should be bent about 90 degrees.  Then push your heels into the floor, squeeze your buttocks, and lift your hips off the floor until your shoulders, hips, and knees are all in a straight line.  Hold for about 6 seconds as you continue to breathe normally, and then slowly lower your hips back down to the floor and rest for up to 10 seconds.  Do 8 to 12 repetitions.  Hamstring stretch in doorway    Lie on your back in a doorway, with one leg through the open door.  Slide your leg up the wall to straighten your knee. You should feel a gentle stretch down the back of your leg.  Hold the stretch for at least 15 to 30 seconds. Do not arch your back, point your toes, or bend either knee. Keep one heel touching the floor and the other heel touching the wall.  Repeat with your other leg.  Do 2 to 4 times for each leg.  Hip flexor  stretch      Kneel on the floor with one knee bent and one leg behind you. Place your forward knee over your foot. Keep your other knee touching the floor.  Slowly push your hips forward until you feel a stretch in the upper thigh of your rear leg.  Hold the stretch for at least 15 to 30 seconds. Repeat with your other leg.  Do 2 to 4 times on each side.  Wall sit    Stand with your back 10 to 12 inches away from a wall.  Lean into the wall until your back is flat against it.  Slowly slide down until your knees are slightly bent, pressing your lower back into the wall.  Hold for about 6 seconds, then slide back up the wall.  Repeat 8 to 12 times.  Follow-up care is a key part of your treatment and safety. Be sure to make and go to all appointments, and call your doctor if you are having problems. It's also a good idea to know your test results and keep a list of the medicines you take.  Care instructions adapted under license by [facility]. This care instruction is for use with your licensed healthcare professional. If you have questions about a medical condition or this instruction, always ask your healthcare professional. [employer] disclaims any warranty or liability for your use of this information.

## 2021-05-14 ENCOUNTER — Encounter (INDEPENDENT_AMBULATORY_CARE_PROVIDER_SITE_OTHER): Payer: Self-pay | Admitting: Physician Assistant

## 2021-08-07 ENCOUNTER — Encounter (INDEPENDENT_AMBULATORY_CARE_PROVIDER_SITE_OTHER): Payer: Self-pay | Admitting: Gastroenterology

## 2021-08-07 ENCOUNTER — Telehealth (INDEPENDENT_AMBULATORY_CARE_PROVIDER_SITE_OTHER): Payer: 59 | Admitting: Gastroenterology

## 2021-08-07 ENCOUNTER — Other Ambulatory Visit (FREE_STANDING_LABORATORY_FACILITY): Payer: 59

## 2021-08-07 ENCOUNTER — Other Ambulatory Visit (INDEPENDENT_AMBULATORY_CARE_PROVIDER_SITE_OTHER): Payer: Self-pay | Admitting: Gastroenterology

## 2021-08-07 VITALS — Ht 65.0 in | Wt 250.0 lb

## 2021-08-07 DIAGNOSIS — M5440 Lumbago with sciatica, unspecified side: Secondary | ICD-10-CM

## 2021-08-07 DIAGNOSIS — K51919 Ulcerative colitis, unspecified with unspecified complications: Secondary | ICD-10-CM

## 2021-08-07 DIAGNOSIS — Z9081 Acquired absence of spleen: Secondary | ICD-10-CM

## 2021-08-07 DIAGNOSIS — K750 Abscess of liver: Secondary | ICD-10-CM

## 2021-08-07 DIAGNOSIS — F321 Major depressive disorder, single episode, moderate: Secondary | ICD-10-CM

## 2021-08-07 DIAGNOSIS — R945 Abnormal results of liver function studies: Secondary | ICD-10-CM

## 2021-08-07 DIAGNOSIS — F419 Anxiety disorder, unspecified: Secondary | ICD-10-CM

## 2021-08-07 LAB — CBC AND DIFFERENTIAL
Absolute NRBC: 0 10*3/uL (ref 0.00–0.00)
Basophils Absolute Automated: 0.1 10*3/uL — ABNORMAL HIGH (ref 0.00–0.08)
Basophils Automated: 1 %
Eosinophils Absolute Automated: 0.83 10*3/uL — ABNORMAL HIGH (ref 0.00–0.44)
Eosinophils Automated: 8.1 %
Hematocrit: 39.7 % (ref 34.7–43.7)
Hgb: 13.6 g/dL (ref 11.4–14.8)
Immature Granulocytes Absolute: 0.06 10*3/uL (ref 0.00–0.07)
Immature Granulocytes: 0.6 %
Instrument Absolute Neutrophil Count: 4.26 10*3/uL (ref 1.10–6.33)
Lymphocytes Absolute Automated: 4.29 10*3/uL — ABNORMAL HIGH (ref 0.42–3.22)
Lymphocytes Automated: 41.9 %
MCH: 29.2 pg (ref 25.1–33.5)
MCHC: 34.3 g/dL (ref 31.5–35.8)
MCV: 85.4 fL (ref 78.0–96.0)
MPV: 9.8 fL (ref 8.9–12.5)
Monocytes Absolute Automated: 0.7 10*3/uL (ref 0.21–0.85)
Monocytes: 6.8 %
Neutrophils Absolute: 4.26 10*3/uL (ref 1.10–6.33)
Neutrophils: 41.6 %
Nucleated RBC: 0 /100 WBC (ref 0.0–0.0)
Platelets: 462 10*3/uL — ABNORMAL HIGH (ref 142–346)
RBC: 4.65 10*6/uL (ref 3.90–5.10)
RDW: 14 % (ref 11–15)
WBC: 10.24 10*3/uL — ABNORMAL HIGH (ref 3.10–9.50)

## 2021-08-07 LAB — COMPREHENSIVE METABOLIC PANEL
ALT: 75 U/L — ABNORMAL HIGH (ref 0–55)
AST (SGOT): 47 U/L — ABNORMAL HIGH (ref 5–41)
Albumin/Globulin Ratio: 1.3 (ref 0.9–2.2)
Albumin: 3.5 g/dL (ref 3.5–5.0)
Alkaline Phosphatase: 73 U/L (ref 37–117)
Anion Gap: 6 (ref 5.0–15.0)
BUN: 9 mg/dL (ref 7.0–21.0)
Bilirubin, Total: 0.5 mg/dL (ref 0.2–1.2)
CO2: 24 mEq/L (ref 17–29)
Calcium: 8.7 mg/dL (ref 8.5–10.5)
Chloride: 112 mEq/L — ABNORMAL HIGH (ref 99–111)
Creatinine: 0.8 mg/dL (ref 0.4–1.0)
Globulin: 2.6 g/dL (ref 2.0–3.6)
Glucose: 98 mg/dL (ref 70–100)
Potassium: 4.3 mEq/L (ref 3.5–5.3)
Protein, Total: 6.1 g/dL (ref 6.0–8.3)
Sodium: 142 mEq/L (ref 135–145)
eGFR: >60 mL/min/{1.73_m2} (ref >=60)

## 2021-08-07 LAB — HEPATITIS B CORE ANTIBODY, TOTAL: Hepatitis B Core Total AB: NONREACTIVE

## 2021-08-07 LAB — HEPATITIS B SURFACE ANTIGEN W/ REFLEX TO CONFIRMATION: Hepatitis B Surface Antigen: NONREACTIVE

## 2021-08-07 LAB — HEPATITIS B SURFACE ANTIBODY: HEPATITIS B SURFACE ANTIBODY: <3.31 m[IU]/mL

## 2021-08-07 LAB — HEMOLYSIS INDEX: Hemolysis Index: 5 Index (ref 0–24)

## 2021-08-07 LAB — C-REACTIVE PROTEIN: C-Reactive Protein: 0.4 mg/dL (ref 0.0–1.1)

## 2021-08-07 LAB — HEPATITIS A AB, IGG: Hepatitis A Ab, IGG: NONREACTIVE

## 2021-08-07 LAB — HIV-1/2 AG/AB 4TH GEN. W/ REFLEX: HIV Ag/Ab, 4th Generation: NONREACTIVE

## 2021-08-07 LAB — HEPATITIS C ANTIBODY: Hepatitis C, AB: NONREACTIVE

## 2021-08-07 MED ORDER — PEG 3350/ELECTROLYTES 240 G PO SOLR
ORAL | 0 refills | Status: DC
Start: 2021-08-07 — End: 2021-08-13

## 2021-08-07 MED ORDER — HYDROCORTISONE 100 MG/60ML RE ENEM
100.0000 mg | ENEMA | Freq: Two times a day (BID) | RECTAL | 3 refills | Status: DC
Start: 2021-08-07 — End: 2021-08-07

## 2021-08-07 NOTE — Telephone Encounter (Signed)
Order changed to generic bid

## 2021-08-07 NOTE — Patient Instructions (Addendum)
Kindly see phone number below to schedule colonoscopy ans my nurse will arrange a consult with our IBD specialist. Many thanks!    plit Dose Colonoscopy Preparation Instructions - COLYTE, GAVILYTE,   GOLYTELY, OR NULYTELY    Please carefully read a week before your procedure.  IF YOU ARE ON BLOOD THINNERS (COUMADIN, PLAVIX, etc.), INSULIN OR OTHER   DIABETIC MEDICATIONS, PLEASE LET us KNOW AND CHECK WITH YOUR   PRESCRIBING PHYSICIAN FOR INSRUCTIONS.   Your prescribing provider needs to determine if you should stop or stay on your blood thinner before procedure.  Not following these instructions may result in cancellation.    General Endoscopy Information   Do no chew gum or suck on hard candy the day of your procedure.   You must have a responsible adult to accompany you home after the procedure. This   person must pick you up in the endoscopy unit. If you do not have a responsible adult   to accompany you home, your procedure will be cancelled.    You may not operate a motor vehicle for the remainder of the day following your   procedure.    You may not take a taxi, [employer] Goad or bus home unless accompanied by a responsible   adult.    If your insurance company requires a REFERRAL, YOU MUST BRING IT WITH YOU.   Also, please bring your current insurance card(s), copay (if applicable), and a current   picture ID with you on the day of your procedure.    Our office will contact your insurance carrier to verify coverage and, if required, obtain   preauthorization for your procedure. However, preauthorization is not a guarantee of   payment and you will be responsible for any deductibles, copays, co-insurance, and/or   any other plan specific out-of-pocket expenses.    Dependent upon your family history, personal history, prior gastroenterology   diagnoses, or findings discovered during your colonoscopy, your procedure may be   considered preventative or diagnostic. This determination will not be made until after   the  procedure has concluded and will be based upon the findings of your exam. In our   experience, many insurance carriers cover preventative and diagnostic colonoscopies   differently, and as a result, your out-of-pocket payment may also differ. If you have any   questions about your coverage, please contact your insurance carrier directly.    If you have any questions, please contact your GI physician's office during normal   business hours.    Preparation Instructions    One (1) day before the procedure date: Split Dose: 1st half  1. Drink only clear liquids the entire day. No solid food should be taken. Clear liquids include:  water, broth without any solid pieces, apple juice, white grape juice, pulp free lemonade,   sprite, ginger-ale, coffee or tea without milk or nondairy creamers, plain Jell-) (no added   fruit or toppings, no red, purple, or blue Jello-O). See clear diet below.    2. Prepare the solution: Gavilyte, Golytely, Nulytely or Colyte Prep: mix the powder with   water in the provided plastic container to the 'fill" line and chill in the refrigerator.  3. You may add "Crystal Light" powdered lemonade (as an alternative to the flavor packets)  to the solution to improve its taste.  4. No solid food should be taken during or after the prep.   5. At 5 p.m.: Begin 1st dose of the prep solution at a  rate of 8 ounces every 15-30 minutes   (over 1-2 hours) until half of the prep solution is finished. If you feel full or nauseated by   drinking the solution, then slow down and finish the first half of the solution before   midnight.  6. Please continue to drink clear liquids until you go to bed.  * It is not uncommon for individuals to experience bloating or nausea when drinking the   solution. If vomiting or other symptoms concern you, please call your GI physician's office.    The day of your procedure: Split dose: 2nd half  1. 6 hours before your procedure time: Start 2nd dose of prep solution. Depending on  your   procedure start time this may require waking up early to complete the prep.  2. Drink the solution at a rate of 8 ounces every 15-30 minutes (over 1-2 hours) until you   finish the entire solution.  3. It is important that you finish the remaining 2 liters of the prep solution at least 4 hours  before your scheduled procedure.  4. Complete 2nd dose 4 hours prior to your scheduled start time. Do not take anything   by mouth starting 4 hours before your procedure.  5. Do not eat hard candy or chew gum.   Wear comfortable clothing that is easy to remove and leave jewelry at home. Leave any   valuables (i.e., purse, cell phone etc.) with family or companion.  6. Report to the Endoscopy Procedure Unit 90 minutes before your scheduled procedure   time.  7. Once in the pre-procedural area, you will be asked to put on a hospital gown. A nurse will   review your medical history with you (Bring a list of your current medication, allergies, and   a copy of your recent EKG). An intravenous line (IV) will be started for your sedation   during the procedure.  8. When your procedure is done, you may remain in the recovery room for up to 1 hour.  9. Your doctor will discuss the results of your procedure with you and give you a copy of your   report.     CLEAR LIQUID DIET  THESE ITEMS ARE ALLOWED:  Water  Clear broth: beef, chicken, vegetable  Juices   Apple juice or cider   White grape juice, white cranberry   juice   Tang  Lemonade  Soda (clear color)  Tea  Coffee (without cream)  Clear gelatin (without fruit)  Popsicles (without fruit or cream)  [place] ices    THESE ITEMS ARE NOT ALLOWED:  Milk   Cream  Milshakes  Tomato juice  Orange juice  Cream soups  Any soup other than the listed broth  Oatmeal  Cream of Wheat  Grapefruit juice  Alcohol    PLEASE NOTE: It is extremely important to follow the preparation listed above, so that   the doctor will be able to see your entire colon. Your colon must be clear of any stool.    Inadequate preparation limits the value of this procedure and could necessitate  rescheduling of the examination.     * If you need to reschedule or cancel your appointment, please provide a minimum of 7 days notice.  You can reach [place] at [phone] or [phone], Option 2.      FREQUENTLY ASKED QUESTIONS:    My procedure is in the afternoon. Can I eat in the morning?   A: No. To ensure  your safety during the procedure, it is important that the stomach is empty.   Any food or liquid in the stomach at the time of the procedure places you at risk of aspirating   these contents into the lung leading to a serious complication called aspiration pneumonia.   You can drink water until 4 hours before your procedure time.     I ate breakfast (lunch or dinner) the day before my colonoscopy. Is that okay?   A: If the preparation instructions were not followed properly, residual stool may remain in the   colon and hide important findings from the examining physician. In some cases, if the colon   preparation is not good, you may have to repeat the preparation and the exam. If you   accidentally eat any solid food the day before your exam, please call and ask to speak with a   member of the nursing staff. You may be asked to reschedule your procedure.    I don't have a ride. Is that okay?   A: No. If you do not have a responsible adult to accompany you home, YOUR PROCEDURE WILL BE CANCELLED.    How many days prior to the procedure should I discontinue my Coumadin, Plavix or  other blood thinning medications?   A: If you are taking any blood thinning medications please let your endoscopist know.   Generally speaking, you should quit taking your blood thinning medication 2-7 days prior to   some procedures depending on the medication; however, you must check with your   endoscopist and your prescribing physician to ensure that it is needed and safe for you to do so.    What medications can I take the day before and the day of  my procedure?  A: The day prior to your procedure take your medications the way you normally would.   However, for those patients taking any type of bowel cleansing preparation, be advised that   you may undergo a prolonged period of diarrhea that may flush oral medications out of your system before they have time to take effect. The morning of your procedure you should take any blood pressure or heart medications you may be on with a small sip of water. You can hold most other medications and take them once your procedure has been completed. If you have questions about specific medication(s), please call the provider who prescribed the medication.    I am diabetic. Do I take my insulin?   A: You must direct the question to the physician who placed you on this medication. Please  check your blood sugar the morning of your procedure as you normally would. If you have any questions about your diabetes management in conjunction with your fast for your endoscopic procedure, please consult your primary physician.     I am on pain medication? Can I take it prior to my procedure?   A: You may take your prescription pain medications prior to your procedure with a small sip of water. Please inform the pre-procedural clinical staff of any medications you've taken the day of your procedure. If you have any questions, please call a member of the endoscopy unit clinical staff.    I'm having a menstrual period. Should I reschedule my colonoscopy appointment?   A: No. Your menstrual period will not interfere with your physician's ability to complete your   procedure.     May I continue taking my iron tablets?   A: No.  Iron may cause the formation of dark color stools which can make it difficult for the  physician to complete your colonoscopy if your preparation is less than optimal. We   recommend you stop taking your oral iron supplements at least one week prior to your   procedure.     I have been on Aspirin therapy for my  heart. Should I continue to take it?   A: Aspirin may affect blood coagulation. Please check with your endoscopist and prescribing   physician.     I am having a colonoscopy tomorrow. I started my colon preparation on time but now I   am experiencing diarrhea and/or a bloating feeling. What should I do?   A: Nausea, vomiting and a sense of fullness or bloating can occur any time after beginning   your colon preparation. However, it is important that you drink all the preparation. For most   people, taking an hour break from the preparation will usually help. Then continue taking the  preparation as ordered. If the vomiting returns or symptoms get worse, please call your GI   physician's office.    If you have any questions or concerns, please don't hesitate to call.   Sincerely,  [facility] To schedule or reschedule a procedure, please call my procedure coordinator Murlean Caller at [phone]. Colonoscopy Preparation Instructions - SUPREP    IF YOU ARE ON BLOOD THINNERS (COUMADIN, PLAVIX, etc.), INSULIN OR OTHER   DIABETIC MEDICATIONS, PLEASE LET us KNOW AND CHECK WITH YOUR   PRESCRIBING PHYSICIAN FOR INSRUCTIONS. Your prescribing provider needs to   determine if you should stop or stay on your blood thinner before procedure.  Not following these instructions may result in cancellation.    General Endoscopy Information   Do no chew gum or suck on hard candy the day of your procedure.   You must have a responsible adult to accompany you home after the procedure. This   person must pick you up in the endoscopy unit. If you do not have a responsible adult   to accompany you home, your procedure will be cancelled.    You may not operate a motor vehicle for the remainder of the day following your   procedure.    You may not take a taxi, [employer] Goad or bus home unless accompanied by a responsible   adult.    If your insurance company requires a REFERRAL, YOU MUST BRING IT WITH YOU.   Also, please bring your  current insurance card(s), copay (if applicable), and a current   picture ID with you on the day of your procedure.    Our office will contact your insurance carrier to verify coverage and, if required, obtain   preauthorization for your procedure. However, preauthorization is not a guarantee of   payment and you will be responsible for any deductibles, copays, co-insurance, and/or   any other plan specific out-of-pocket expenses.    Dependent upon your family history, personal history, prior gastroenterology   diagnoses, or findings discovered during your colonoscopy, your procedure may be   considered preventative or diagnostic. This determination will not be made until after   the procedure has concluded and will be based upon the findings of your exam. In our   experience, many insurance carriers cover preventative and diagnostic colonoscopies   differently, and as a result, your out-of-pocket payment may also differ. If you have any   questions about your coverage, please contact your insurance carrier directly.  If you have any questions, please contact your GI physician's office during normal business   hours.    SUPREP PREP KIT  SUPREP Bowel Prep Kit Is taken as a split dose (2-day) regimen. You take the first 6-ounce ounces bottle of SUPREP the evening before your colonoscopy at 5:00 PM. You will take the second 6-ounce bottle of SUPREP the morning of your colonoscopy (5 hours before your procedure). It is important to drink the additional water as recommended in the instructions for use. Both 6-ounce bottles are required for a complete prep.    The day before the procedure:  1. Drink only clear liquids the entire day. No solid food should be taken. Clear liquids include  water, broth without any solid pieces, apple juice, white grape juice, pulp free lemonade,   sprite, ginger-ale, coffee or tea without milk or non-dairy creamers, plain Jello (no added   fruit or toppings, no red, purple, or blue Jello.  See clear liquid diet below).  DO NOT drink milk  DO NOT eat or drink anything colored red or purple  DO NOT drink alcoholic beverages   2. Complete steps 1 through 4 using (1) 6-ounce bottle starting at 5:00 p.m.:   Step 1:  Pour ONE (1) 6-ounce bottle of SUPREP liquid into the mixing container.  Step 2:  Add cool drinking water to the 16-ounces line on the container and mix.  Step 3:  Drink ALL the liquid in the container.   Step 4:  You must drink two (2) more 16-ounces containers of water over the next 1 hour.  3. No solid food should be taken during or after the prep.     The day of your procedure:  1. 5 hours before your procedure time: Start 2nd dose of SUPREP and complete steps 1-4   using the other 6-ounce bottle:  Step 1:  Pour ONE (1) 6-ounce bottle of SUPREP liquid into the mixing container.   Step 2:  Add cool drinking water to the 16-ounce line on the container and mix.  Step 3:  Drink ALL the liquid in the container.  Step 4:  You must drink two (2) more 16-ounce containers of water over the next 1 hour.   NOTE: you must finish drinking the final glass of water at least 4 hours, or as directed, before  your procedure.  2. It is important that you finish the remaining dose at least 4 hours before your scheduled   procedure.   3. Do not take anything by mouth starting 4 hours before your procedure.  4. Do not eat hard candy or chew gum.   Wear comfortable clothing that is easy to remove and leave jewelry at home. Leave any   valuables (i.e., purse, cell phone etc.) with family or companion.  5. Report to the Endoscopy Procedure Unit 90 minutes before your scheduled procedure   time.   6. Once in the pre-procedural area, you will be asked to put on a hospital gown. A nurse will   review your medical history with you (Bring a list of your current medication, allergies, and   a copy of your recent EKG). An intravenous line (IV) will be started for your sedation   during the procedure.   7. When your  procedure is done, you may remain in the recovery room for up to 1 hour.   8. Your doctor will discuss the results of your procedure with you and give you a copy of  your   report.   * It is not uncommon for individuals to experience bloating or nausea when drinking a solution.  If have vomiting or other concerning symptoms, please call your GI physician's office.    PLEASE NOTE: It is extremely important to follow the preparation listed above, so that   the doctor will be able to see your entire colon. Your colon must be clear of any stool.   Inadequate preparation limits the value of this procedure and could necessitate   rescheduling the examination.   * If you need to reschedule or cancel your appointment, please call your GI physician's  office.     FREQUENTLY ASKED QUESTIONS:  My procedure is in the afternoon. Can I eat in the morning?   A: No. To ensure your safety during the procedure, it is important that the stomach is empty.   Any food or liquid in the stomach at the time of the procedure places you at risk of aspirating   these contents into the lung leading to a serious complication called aspiration pneumonia.   You can drink water until 4 hours before your procedure time.     I ate breakfast (lunch or dinner) the day before my colonoscopy. Is that okay?   A: If the preparation instructions were not followed properly, residual stool may remain in the   colon and hide important findings from the examining physician. In some cases, if the colon   preparation is not good, you may have to repeat the preparation and the exam. If you   accidentally eat any solid food the day before your exam, please call and ask to speak with a   member of the nursing staff. You may be asked to reschedule your procedure.    I don't have a ride. Is that okay?   A: No. If you do not have a responsible adult to accompany you home, YOUR PROCEDURE   WILL BE CANCELLED.    How many days prior to the procedure should I discontinue my  Coumadin, Plavix or   other blood thinning medications?   A: If you are taking any blood thinning medications please let your endoscopist know.   Generally speaking, you should quit taking your blood thinning medication 2-7 days prior to   some procedures depending on the medication; however, you must check with your   endoscopist and your prescribing physician to ensure that it is needed and safe for you to do   so.     What medications can I take the day before and the day of my procedure?  A: The day prior to your procedure take your medications the way you normally would.   However, for those patients taking any type of bowel cleansing preparation, be advised that   you may undergo a prolonged period of diarrhea that may flush oral medications out of your system before they have time to take effect. The morning of your procedure you should take any blood pressure or heart medications you may be on with a small sip of water. You can hold most other medications and take them once your procedure has been completed. If you have questions about specific medication(s), please call a member of the endoscopy unit clinical staff.     I am diabetic. Do I take my insulin?   A: You must direct the question to the physician who placed you on this medication. Please   check your blood sugar the morning of  your procedure as you normally would. If you have any questions about your diabetes management in conjunction with your fast for your endoscopic procedure, please consult your primary physician.    I am on pain medication? Can I take it prior to my procedure?   A: You may take your prescription pain medications prior to your procedure with a small sip of water. Please inform the pre-procedural clinical staff of any medications you've taken the day of your procedure. If you have any questions, please call a member of the endoscopy unit clinical staff.     I'm having a menstrual period. Should I reschedule my colonoscopy  appointment?   A: No. Your menstrual period will not interfere with your physician's ability to complete your   procedure.     May I continue taking my iron tablets?   A: No. Iron may cause the formation of dark color stools which can make it difficult for the   physician to complete your colonoscopy if your preparation is less than optimal. We   recommend you stop taking your oral iron supplements at least one week prior to your   procedure.     I have been on Aspirin therapy for my heart. Should I continue to take it?   A: Aspirin may affect blood coagulation. Please check with your endoscopist and prescribing   physician.     I am having a colonoscopy tomorrow. I started my colon preparation on time but now I   am experiencing diarrhea and/or a bloating feeling. What should I do?   A: Nausea, vomiting and a sense of fullness or bloating can occur any time after beginning   your colon preparation. However, it is important that you drink all the preparation. For most   people, taking an hour break from the preparation will usually help. Then continue taking the preparation as ordered. If the vomiting returns or symptoms get worse, please call your GI physician's office.    If you have any questions or concerns, please don't hesitate to call us at [phone] or [phone], option 2.    Sincerely,  [facility]astroenterology Team
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
## 2021-08-08 ENCOUNTER — Telehealth (INDEPENDENT_AMBULATORY_CARE_PROVIDER_SITE_OTHER): Payer: Self-pay

## 2021-08-08 NOTE — Telephone Encounter (Signed)
Scheduled pt w [facility] 5/24 iah 9:15am

## 2021-08-11 ENCOUNTER — Telehealth (INDEPENDENT_AMBULATORY_CARE_PROVIDER_SITE_OTHER): Payer: Self-pay

## 2021-08-11 NOTE — Telephone Encounter (Signed)
Patient requests a change in prep to "something smaller. A gallon makes me throw up". Will route to MD

## 2021-08-12 ENCOUNTER — Other Ambulatory Visit (INDEPENDENT_AMBULATORY_CARE_PROVIDER_SITE_OTHER): Payer: Self-pay | Admitting: Gastroenterology

## 2021-08-12 DIAGNOSIS — F419 Anxiety disorder, unspecified: Secondary | ICD-10-CM | POA: Insufficient documentation

## 2021-08-12 DIAGNOSIS — K750 Abscess of liver: Secondary | ICD-10-CM

## 2021-08-12 DIAGNOSIS — F321 Major depressive disorder, single episode, moderate: Secondary | ICD-10-CM

## 2021-08-12 DIAGNOSIS — K51919 Ulcerative colitis, unspecified with unspecified complications: Secondary | ICD-10-CM | POA: Insufficient documentation

## 2021-08-12 DIAGNOSIS — Z9081 Acquired absence of spleen: Secondary | ICD-10-CM

## 2021-08-12 DIAGNOSIS — M5440 Lumbago with sciatica, unspecified side: Secondary | ICD-10-CM

## 2021-08-13 ENCOUNTER — Other Ambulatory Visit (INDEPENDENT_AMBULATORY_CARE_PROVIDER_SITE_OTHER): Payer: Self-pay | Admitting: Gastroenterology

## 2021-08-13 ENCOUNTER — Telehealth (INDEPENDENT_AMBULATORY_CARE_PROVIDER_SITE_OTHER): Payer: Self-pay

## 2021-08-13 ENCOUNTER — Encounter (INDEPENDENT_AMBULATORY_CARE_PROVIDER_SITE_OTHER): Payer: Self-pay

## 2021-08-13 DIAGNOSIS — M5440 Lumbago with sciatica, unspecified side: Secondary | ICD-10-CM

## 2021-08-13 DIAGNOSIS — K750 Abscess of liver: Secondary | ICD-10-CM

## 2021-08-13 DIAGNOSIS — R945 Abnormal results of liver function studies: Secondary | ICD-10-CM | POA: Insufficient documentation

## 2021-08-13 DIAGNOSIS — F419 Anxiety disorder, unspecified: Secondary | ICD-10-CM

## 2021-08-13 DIAGNOSIS — Z9081 Acquired absence of spleen: Secondary | ICD-10-CM

## 2021-08-13 DIAGNOSIS — F321 Major depressive disorder, single episode, moderate: Secondary | ICD-10-CM

## 2021-08-13 DIAGNOSIS — K51919 Ulcerative colitis, unspecified with unspecified complications: Secondary | ICD-10-CM

## 2021-08-13 MED ORDER — NA SULFATE-K SULFATE-MG SULF 17.5-3.13-1.6 GM/177ML PO SOLN
ORAL | 0 refills | Status: DC
Start: 2021-08-13 — End: 2021-08-13

## 2021-08-13 NOTE — Addendum Note (Signed)
Addended by: Doreen Beam on: 08/13/2021 11:02 AM     Modules accepted: Orders

## 2021-08-13 NOTE — Addendum Note (Signed)
Addended by: Doreen Beam on: 08/13/2021 09:13 AM     Modules accepted: Orders

## 2021-08-13 NOTE — Telephone Encounter (Signed)
Called pt to reschedule procedure. lvm stating [facility] recommended the procedure be done wth another provider and the 5/24 appt will be canceled.

## 2021-08-13 NOTE — Telephone Encounter (Signed)
Left voicemail and made patient aware that meds have been sent to the pharmacy.

## 2021-08-13 NOTE — Telephone Encounter (Signed)
Pts procedure is scheduled for 5/24 with [facility] 9:15am.

## 2021-08-14 ENCOUNTER — Other Ambulatory Visit (INDEPENDENT_AMBULATORY_CARE_PROVIDER_SITE_OTHER): Payer: Self-pay | Admitting: Gastroenterology

## 2021-08-15 ENCOUNTER — Telehealth (INDEPENDENT_AMBULATORY_CARE_PROVIDER_SITE_OTHER): Payer: Self-pay

## 2021-08-15 ENCOUNTER — Encounter (INDEPENDENT_AMBULATORY_CARE_PROVIDER_SITE_OTHER): Payer: Self-pay

## 2021-08-15 NOTE — Telephone Encounter (Signed)
LVM and mychart for pt to schedule appt with Dr. Alisia Ferrari for ulcerative colitis. Soonest available F/U but book for 45 mins. Please secure chat me if there ar issues scheduling.

## 2021-08-18 NOTE — Telephone Encounter (Signed)
LVM on pt's cell but reached home number and they will relay the message that we're trying to call and schedule appt for her with Dr. Alisia Ferrari for ulcerative colitis. Please secure chat me to schedule appt.

## 2021-08-19 ENCOUNTER — Other Ambulatory Visit (INDEPENDENT_AMBULATORY_CARE_PROVIDER_SITE_OTHER): Payer: Self-pay | Admitting: Gastroenterology

## 2021-08-19 ENCOUNTER — Telehealth (INDEPENDENT_AMBULATORY_CARE_PROVIDER_SITE_OTHER): Payer: Self-pay

## 2021-08-19 DIAGNOSIS — K51919 Ulcerative colitis, unspecified with unspecified complications: Secondary | ICD-10-CM

## 2021-08-19 LAB — MISCELLANEOUS QUEST TEST

## 2021-08-19 MED ORDER — HYDROCORTISONE 100 MG/60ML RE ENEM
100.0000 mg | ENEMA | Freq: Two times a day (BID) | RECTAL | 11 refills | Status: DC
Start: 2021-08-19 — End: 2021-08-19

## 2021-08-19 NOTE — Telephone Encounter (Signed)
Pt spoke with scheduling earlier and said the prescription is not at the pharmacy- [facility] [address] [place], [place].     hydrocortisone (CORTENEMA) 100 MG/60ML enema

## 2021-08-19 NOTE — Telephone Encounter (Signed)
Called pt LVM advising rx sent.

## 2021-08-19 NOTE — Telephone Encounter (Signed)
I have again reordered hydrocortisone enema. Kindly let e know if problem and why? Thanks brad Delphi

## 2021-08-19 NOTE — Telephone Encounter (Signed)
Called [facility] [address] [place], [place]- spoke with pharmacist who confirmed they never received a order for the hydrocortisone enema.

## 2021-08-20 ENCOUNTER — Encounter: Admission: RE | Payer: Self-pay | Source: Ambulatory Visit

## 2021-08-20 ENCOUNTER — Ambulatory Visit: Admit: 2021-08-20 | Payer: 59 | Admitting: Gastroenterology

## 2021-08-20 ENCOUNTER — Telehealth (INDEPENDENT_AMBULATORY_CARE_PROVIDER_SITE_OTHER): Payer: Self-pay | Admitting: Gastroenterology

## 2021-08-20 ENCOUNTER — Ambulatory Visit: Admission: RE | Admit: 2021-08-20 | Payer: 59 | Source: Ambulatory Visit | Admitting: Gastroenterology

## 2021-08-20 DIAGNOSIS — K750 Abscess of liver: Secondary | ICD-10-CM | POA: Insufficient documentation

## 2021-08-20 DIAGNOSIS — R945 Abnormal results of liver function studies: Secondary | ICD-10-CM | POA: Insufficient documentation

## 2021-08-20 DIAGNOSIS — Z9081 Acquired absence of spleen: Secondary | ICD-10-CM

## 2021-08-20 DIAGNOSIS — K51919 Ulcerative colitis, unspecified with unspecified complications: Secondary | ICD-10-CM | POA: Insufficient documentation

## 2021-08-20 DIAGNOSIS — M5440 Lumbago with sciatica, unspecified side: Secondary | ICD-10-CM | POA: Insufficient documentation

## 2021-08-20 DIAGNOSIS — F321 Major depressive disorder, single episode, moderate: Secondary | ICD-10-CM | POA: Insufficient documentation

## 2021-08-20 DIAGNOSIS — F419 Anxiety disorder, unspecified: Secondary | ICD-10-CM | POA: Insufficient documentation

## 2021-08-20 SURGERY — DONT USE, USE 1094-COLONOSCOPY, DIAGNOSTIC (SCREENING)
Anesthesia: Monitor Anesthesia Care | Site: Anus

## 2021-08-20 NOTE — Telephone Encounter (Signed)
I called pt and left a detailed voicemail with the instructions to get the reprt/path from her colonoscopy she had in December. I also informed her to give me a call to be scheduled with Rinaldo Cloud.

## 2021-08-21 ENCOUNTER — Encounter (INDEPENDENT_AMBULATORY_CARE_PROVIDER_SITE_OTHER): Payer: Self-pay

## 2021-11-24 ENCOUNTER — Encounter (INDEPENDENT_AMBULATORY_CARE_PROVIDER_SITE_OTHER): Payer: Self-pay

## 2021-12-23 ENCOUNTER — Ambulatory Visit (INDEPENDENT_AMBULATORY_CARE_PROVIDER_SITE_OTHER): Payer: 59 | Admitting: Nurse Practitioner

## 2023-09-21 ENCOUNTER — Encounter (INDEPENDENT_AMBULATORY_CARE_PROVIDER_SITE_OTHER): Payer: Self-pay

## 2023-09-21 ENCOUNTER — Ambulatory Visit (FREE_STANDING_LABORATORY_FACILITY): Admitting: Family

## 2023-09-21 ENCOUNTER — Telehealth (INDEPENDENT_AMBULATORY_CARE_PROVIDER_SITE_OTHER): Payer: Self-pay

## 2023-09-21 VITALS — BP 112/69 | HR 88 | Temp 98.0°F | Resp 18 | Ht 65.0 in | Wt 252.2 lb

## 2023-09-21 DIAGNOSIS — Z113 Encounter for screening for infections with a predominantly sexual mode of transmission: Secondary | ICD-10-CM

## 2023-09-21 DIAGNOSIS — R059 Cough, unspecified: Secondary | ICD-10-CM

## 2023-09-21 DIAGNOSIS — R0981 Nasal congestion: Secondary | ICD-10-CM

## 2023-09-21 DIAGNOSIS — Z135 Encounter for screening for eye and ear disorders: Secondary | ICD-10-CM

## 2023-09-21 LAB — LAB USE ONLY - LAVENDER - EDTA HOLD TUBE

## 2023-09-21 LAB — POC COVID QUICKVUE ANTIGEN: QuickVue SARS COV2 Antigen POCT: NEGATIVE

## 2023-09-21 LAB — LAB USE ONLY - GOLD SST HOLD TUBE

## 2023-09-21 NOTE — Progress Notes (Signed)
 Patient: Cheryl Mcneil   Date: 09/21/2023   MRN: [id number]           Subjective     Chief Complaint   Patient presents with    STI Screening     Pt reports exposure to ureaplasma. Pt reports no symptoms.     Cough     Pt c/o productive cough & intermittent congestion x2 days.           Cough  Pertinent negatives include no headaches or rash.      Cheryl Mcneil is a 28 y.o. who complains of exposure to ureoplasma via partner . Pt denies any symptoms at this time.   Type of sexual contact: oral and vagina;  Risk factors: multiple sexual partners  History of IVDU: no  Has not attempted any alleviating measures prior to visit..  Patient's last menstrual period was 09/18/2023 (approximate).      History:  Pertinent Past Medical, Surgical, Family and Social History were reviewed.  Current Medications[1]  Allergies[2]  Medications and Allergies reviewed.         Objective   Vitals:    09/21/23 1310   BP: 112/69   BP Site: Left arm   Patient Position: Sitting   Cuff Size: Large   Pulse: 88   Resp: 18   Temp: 98 F (36.7 C)   TempSrc: Tympanic   SpO2: 97%   Weight: 114.4 kg (252 lb 3.2 oz)   Height: 1.651 m (5' 5)     Body mass index is 41.97 kg/m.    Physical Exam  Constitutional:       General: She is not in acute distress.     Appearance: Normal appearance. She is not ill-appearing, toxic-appearing or diaphoretic.   HENT:      Head: Normocephalic and atraumatic.      Right Ear: Hearing, ear canal and external ear normal. No tenderness. A middle ear effusion is present. Tympanic membrane is bulging. Tympanic membrane is not injected or erythematous.      Left Ear: Hearing, ear canal and external ear normal. A middle ear effusion is present. Tympanic membrane is bulging. Tympanic membrane is not injected or erythematous.      Nose: Mucosal edema and congestion present. No rhinorrhea.      Right Turbinates: Enlarged, swollen and pale.      Left Turbinates: Enlarged, swollen and pale.      Mouth/Throat:       Lips: Pink.      Mouth: Mucous membranes are moist.      Pharynx: Uvula midline. No oropharyngeal exudate, posterior oropharyngeal erythema or postnasal drip.      Tonsils: 1+ on the right. 1+ on the left.     Cardiovascular:      Rate and Rhythm: Normal rate and regular rhythm.   Pulmonary:      Effort: Pulmonary effort is normal. No respiratory distress.      Breath sounds: Normal breath sounds. No stridor. No wheezing, rhonchi or rales.   Lymphadenopathy:      Cervical: Cervical adenopathy present.      Right cervical: Superficial cervical adenopathy present.      Left cervical: Superficial cervical adenopathy present.     Neurological:      Mental Status: She is alert.              UCC Course   There were no labs reviewed with this patient during the visit.  There were no  x-rays reviewed with this patient during the visit.  Current Inpatient Medications with Last Dose Taken[3]       Procedures   Procedures      MDM/Assessment    Based on patient's physical exam findings, past medical history current diagnosis is and STI screening    Differential Diagnosis including but not limited to: Hepatitis B, hepatitis C, gonorrhea, chlamydia, ureaplasma, trichomonas, Syphilis    Encounter Diagnoses   Name Primary?    Routine screening for STI (sexually transmitted infection) Yes    Screening for eye condition           Plan    Patient with vital signs stable well-appearing nontoxic stable to discharge home.  ED precautions reviewed.  Informed patient to call the clinic with any questions or concerns.  Follow-up with PCP for continuity of care.  Patient understands and agrees with plan of care.  Pt verbalizes understanding of plan of care and had no further questions or concerns.     After discussion of risks/benefits with patient, will treat per results, Abstain from intercourse until test results return, and Follow up with primary care provider for further management    Orders Placed This Encounter   Procedures     Chlamydia trachomatis, Neisseria gonorrhoeae, Mycoplasma genitalium and Trichomonas vaginalis    Chlamydia trachomatis and Neisseria gonorrhoeae, PCR    Hepatitis B (HBV) Surface Antigen with Reflex to Confirmation    Hepatitis B Surface (HBV) Antibody, Quantitative    Hepatitis C Antibody, Total    Gold SST Hold Tube    HIV-1/2, Antigen and Antibody with Reflex to Confirmation    Lavender - EDTA Hold Tube    Gold SST Hold Tube    Syphilis Screen, IgG and IgM    Genital Mycoplasmas NAA, Urine    Ambulatory referral to Ophthalmology    QuickVue SARS-COV-2 Antigen POCT     Requested Prescriptions      No prescriptions requested or ordered in this encounter       Discussed results and diagnosis with patient/family.  Reviewed warning signs for worsening condition, as well as, indications for follow-up with primary care physician and return to urgent care clinic.   Patient/family expressed understanding of instructions.     An After Visit Summary with pertinent information was made available to patient/family via MyChart or in-print.             [1]   Current Outpatient Medications:     Adalimumab (HUMIRA SC), Inject into the skin., Disp: , Rfl:     b complex vitamins tablet, Take 1 tablet by mouth daily., Disp: , Rfl:     ferrous sulfate 220 (44 FE) MG/5ML solution, Take 220 mg by mouth daily., Disp: , Rfl:     metoclopramide  (REGLAN ) 5 MG tablet, Take 1 tablet (5 mg total) by mouth 4 (four) times daily., Disp: 20 tablet, Rfl: 0    Multiple Vitamin (MULTIVITAMIN) tablet, Take 1 tablet by mouth daily., Disp: , Rfl:     Na sulfate, K sulfate, Mg sulfate (SUPREP) oral solution, TAKE ONE KIT (2BOTTLES IN ONE PACKAGE) FOLLOWING THE INSTRUCTIONS SENT FROM YOUR GI DOCTOR'S OFFICE (Patient not taking: Reported on 09/21/2023), Disp: 354 mL, Rfl: 0    pantoprazole  (PROTONIX ) 40 MG tablet, Take 40 mg by mouth daily., Disp: , Rfl:     sucralfate  (CARAFATE ) 1 G tablet, Take 1 tablet (1 g total) by mouth 4 (four) times daily., Disp:  20 tablet, Rfl: 0  [2]  Allergies  Allergen Reactions    Cat Dander     Other      Tumeric and polllon   [3]   No current facility-administered medications for this visit.

## 2023-09-21 NOTE — Patient Instructions (Signed)
 Patient given ophthalmology referral.  Educated on safe sex practices.  Educated to abstain from sexual intercourse until test results are in.

## 2023-09-21 NOTE — Telephone Encounter (Signed)
 RN received call asking to be scheduled due to possible ureaplasma exposure, used to visit our clinic as a child but hasn't been in years. Patient advised that likely would need new patient appointment to be seen and treated, scheduling phone number provided. Patient verbalized understanding and agreed to schedule.     Mace Pass, RN

## 2023-09-22 LAB — CHLAMYDIA TRACHOMATIS, NEISSERIA GONORRHOEAE, MYCOPLASMA GENITALIUM AND TRICHOMONAS VAGINALIS
Chlamydia trachomatis DNA: NEGATIVE
Mycoplasma genitalium DNA: NEGATIVE
Neisseria gonorrhoeae DNA: NEGATIVE
Trichomonas vaginalis DNA: NEGATIVE

## 2023-09-22 LAB — HEPATITIS B SURFACE (HBV) ANTIBODY, QUANTITATIVE: Hepatitis B Surface Antibody: <3.31 m[IU]/mL

## 2023-09-22 LAB — CHLAMYDIA TRACHOMATIS AND NEISSERIA GONORRHOEAE, PCR
Chlamydia trachomatis DNA: NEGATIVE
Neisseria gonorrhoeae DNA: NEGATIVE

## 2023-09-22 LAB — HIV-1/2, ANTIGEN AND ANTIBODY WITH REFLEX TO CONFIRMATION: HIV Ag/Ab 4th Generation: NONREACTIVE

## 2023-09-22 LAB — SYPHILIS SCREEN, IGG AND IGM: Syphilis Screen IgG and IgM: NONREACTIVE

## 2023-09-22 LAB — HEPATITIS C ANTIBODY, TOTAL: Hepatitis C Antibody: NONREACTIVE

## 2023-09-22 LAB — HEPATITIS B (HBV) SURFACE ANTIGEN WITH REFLEX TO CONFIRMATION: Hepatitis B Surface Antigen: NONREACTIVE

## 2023-09-24 ENCOUNTER — Ambulatory Visit (INDEPENDENT_AMBULATORY_CARE_PROVIDER_SITE_OTHER): Payer: Self-pay | Admitting: Medical

## 2023-09-24 LAB — GENITAL MYCOPLASMAS NAA, URINE
Mycoplasma genitalium NAA: NEGATIVE
Mycoplasma hominis NAA: NEGATIVE
Ureaplasma Species: POSITIVE — AB

## 2023-09-25 NOTE — Telephone Encounter (Signed)
 Attempt #1- Tried calling pt regarding culture results. Left VM to call back. Positive for ureaplasma. Does not need tx if she is asx.

## 2023-09-26 NOTE — Telephone Encounter (Signed)
 Attempt #2- Tried calling pt regarding culture results. Left VM to call back. Positive for ureaplasma. Does not need tx if she is asx. Same number in clockwise as in Epic.

## 2023-09-27 ENCOUNTER — Telehealth (INDEPENDENT_AMBULATORY_CARE_PROVIDER_SITE_OTHER): Payer: Self-pay | Admitting: Family Nurse Practitioner

## 2023-09-27 DIAGNOSIS — N898 Other specified noninflammatory disorders of vagina: Secondary | ICD-10-CM

## 2023-09-27 MED ORDER — DOXYCYCLINE HYCLATE 100 MG PO TABS
100.0000 mg | ORAL_TABLET | Freq: Two times a day (BID) | ORAL | 0 refills | Status: AC
Start: 2023-09-27 — End: 2023-10-04

## 2023-09-27 NOTE — Telephone Encounter (Signed)
 Attempt #3: Tried calling pt regarding culture results. Left VM to call back. Positive for ureaplasma. Does not need tx if she is asx. Same number in clockwise as in Epic. 3 attempts, letter sent.

## 2023-09-27 NOTE — Telephone Encounter (Signed)
 Pt called requesting abx for Ureaplasma. Upset we had to tried to call her at 9 am as she was trying to sleep. Requesting Doxy. When I tried to discuss SE with her, she stated don't waste your time I know them. Medication sent to pharmacy.

## 2023-09-28 ENCOUNTER — Telehealth: Payer: Self-pay

## 2023-09-28 NOTE — Telephone Encounter (Signed)
 Note to scheduling agent: This telephone encounter should be sent as high priority to the appropriate front office pool.    Dr. Adine Fonder s patient Cheryl Mcneil with [id number], called in today to transfer their care to Dr. Phil  The standard workflow process has been followed and the patient has been scheduled for the next available [facility] appointment and a 6 week follow up has also been scheduled.  The patient expressed urgency in their request to see the provider due to:(Colitis ) The patient has been informed that a telephone encounter is being sent to their office of choice and that someone will be in touch with them.   Please contact the patient to coordinate a sooner transfer of care appointment. The patient can be reached at Mobile #[phone] (mobile) or Home #:.     Preferred date or time frame: Early Afternoon sometime between Tuesday & Friday   Preferred visit type (In-person or video):  Either or vd or Inperson   Is patient okay with being contacted via MyChart: Yes     Additional Notes:   Per pt seen Adine Fonder in 2023 as an VD , called to see fi she can get seen with another provider& doesn't mind going to the [facility] location .

## 2023-09-29 ENCOUNTER — Other Ambulatory Visit

## 2023-11-02 ENCOUNTER — Encounter (INDEPENDENT_AMBULATORY_CARE_PROVIDER_SITE_OTHER): Payer: Self-pay | Admitting: Family Medicine

## 2023-11-03 ENCOUNTER — Ambulatory Visit (FREE_STANDING_LABORATORY_FACILITY): Admitting: Family Medicine

## 2023-11-03 ENCOUNTER — Ambulatory Visit (INDEPENDENT_AMBULATORY_CARE_PROVIDER_SITE_OTHER): Payer: Self-pay | Admitting: Family Medicine

## 2023-11-03 ENCOUNTER — Encounter (INDEPENDENT_AMBULATORY_CARE_PROVIDER_SITE_OTHER): Payer: Self-pay | Admitting: Family Medicine

## 2023-11-03 VITALS — BP 126/89 | HR 104 | Temp 98.3°F | Ht 65.79 in | Wt 249.6 lb

## 2023-11-03 DIAGNOSIS — K5120 Ulcerative (chronic) proctitis without complications: Secondary | ICD-10-CM

## 2023-11-03 DIAGNOSIS — R7989 Other specified abnormal findings of blood chemistry: Secondary | ICD-10-CM

## 2023-11-03 DIAGNOSIS — N760 Acute vaginitis: Secondary | ICD-10-CM

## 2023-11-03 DIAGNOSIS — M357 Hypermobility syndrome: Secondary | ICD-10-CM

## 2023-11-03 DIAGNOSIS — H539 Unspecified visual disturbance: Secondary | ICD-10-CM

## 2023-11-03 LAB — URINALYSIS WITH MICROSCOPIC EXAM
Urine Bilirubin: NEGATIVE
Urine Blood: NEGATIVE
Urine Glucose: NEGATIVE
Urine Ketones: NEGATIVE mg/dL
Urine Nitrite: NEGATIVE
Urine Protein: NEGATIVE
Urine Specific Gravity: 1.015 (ref 1.001–1.035)
Urine Urobilinogen: NORMAL mg/dL (ref 0.2–2.0)
Urine pH: 6.5 (ref 5.0–8.0)

## 2023-11-03 LAB — LAB USE ONLY - CBC WITH DIFFERENTIAL
Absolute Basophils: 0.08 x10 3/uL (ref 0.00–0.08)
Absolute Eosinophils: 0.51 x10 3/uL — ABNORMAL HIGH (ref 0.00–0.44)
Absolute Immature Granulocytes: 0.09 x10 3/uL — ABNORMAL HIGH (ref 0.00–0.07)
Absolute Lymphocytes: 4.51 x10 3/uL — ABNORMAL HIGH (ref 0.42–3.22)
Absolute Monocytes: 0.69 x10 3/uL (ref 0.21–0.85)
Absolute Neutrophils: 5.35 x10 3/uL (ref 1.10–6.33)
Absolute nRBC: 0 x10 3/uL (ref <=0.00)
Basophils %: 0.7 %
Eosinophils %: 4.5 %
Hematocrit: 40.4 % (ref 34.7–43.7)
Hemoglobin: 13.5 g/dL (ref 11.4–14.8)
Immature Granulocytes %: 0.8 %
Lymphocytes %: 40.2 %
MCH: 28.4 pg (ref 25.1–33.5)
MCHC: 33.4 g/dL (ref 31.5–35.8)
MCV: 84.9 fL (ref 78.0–96.0)
MPV: 9.7 fL (ref 8.9–12.5)
Monocytes %: 6.1 %
Neutrophils %: 47.7 %
Platelet Count: 552 x10 3/uL — ABNORMAL HIGH (ref 142–346)
Preliminary Absolute Neutrophil Count: 5.35 x10 3/uL (ref 1.10–6.33)
RBC: 4.76 x10 6/uL (ref 3.90–5.10)
RDW: 14 % (ref 11–15)
WBC: 11.23 x10 3/uL — ABNORMAL HIGH (ref 3.10–9.50)
nRBC %: 0 /100{WBCs} (ref <=0.0)

## 2023-11-03 LAB — HEPATIC FUNCTION PANEL (LFT)
ALT: 31 U/L (ref <=55)
AST (SGOT): 23 U/L (ref <=41)
Albumin/Globulin Ratio: 1.3 (ref 0.9–2.2)
Albumin: 4 g/dL (ref 3.5–5.0)
Alkaline Phosphatase: 79 U/L (ref 37–117)
Bilirubin Direct: 0.2 mg/dL (ref 0.0–0.5)
Bilirubin Indirect: 0.2 mg/dL (ref 0.2–1.0)
Bilirubin, Total: 0.4 mg/dL (ref 0.2–1.2)
Globulin: 3.1 g/dL (ref 2.0–3.6)
Hemolysis Index: 8 {index}
Protein, Total: 7.1 g/dL (ref 6.0–8.3)

## 2023-11-03 LAB — BASIC METABOLIC PANEL
Anion Gap: 7 (ref 5.0–15.0)
BUN: 14 mg/dL (ref 7–21)
CO2: 25 meq/L (ref 17–29)
Calcium: 9.4 mg/dL (ref 8.5–10.5)
Chloride: 110 meq/L (ref 99–111)
Creatinine: 0.8 mg/dL (ref 0.4–1.0)
GFR: >60 mL/min/1.73 m2 (ref >=60.0)
Glucose: 92 mg/dL (ref 70–100)
Hemolysis Index: 8 {index}
Potassium: 4.5 meq/L (ref 3.5–5.3)
Sodium: 142 meq/L (ref 135–145)

## 2023-11-03 MED ORDER — FLUCONAZOLE 150 MG PO TABS
150.0000 mg | ORAL_TABLET | Freq: Once | ORAL | 0 refills | Status: AC
Start: 2023-11-03 — End: 2023-11-03

## 2023-11-03 NOTE — Assessment & Plan Note (Signed)
 Re-establish with GI that specializes in seeing UC  Orders:    Referral to Gastroenterology ([place]); Future

## 2023-11-03 NOTE — Progress Notes (Signed)
 Have you seen any specialists/other providers since your last visit with us ?    No      The patient was informed that the following HM items are still outstanding:   Health Maintenance Due   Topic Date Due    Pap Smear  Never done    Influenza Vaccine  10/29/2023

## 2023-11-03 NOTE — Patient Instructions (Signed)
 Places that do genetic testing for hypermobile conditions such as Ehlers-Danlos syndrome (EDS):      [facility]: [phone] Dr. Darrick in [facility] (pediatric and adults)  [facility]:   P: [phone]   Email: [email or web address]   Website: [email or web address]    Places that do clinical testing for hypermobile conditions such as Ehlers-Danlos syndrome (EDS):    [facility]: [phone] (Do not accept insurance, initial visit with EDS diagnosis included for $775)  Camille Hobby, MD Board-certified Rheumatologist, Internist, and Acupuncturist, Phone: [phone], Fax: [phone], [email or web address]
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
## 2023-11-04 ENCOUNTER — Telehealth (INDEPENDENT_AMBULATORY_CARE_PROVIDER_SITE_OTHER): Payer: Self-pay | Admitting: Family Medicine

## 2023-11-04 NOTE — Telephone Encounter (Signed)
 Called the office of Dr Alethia to schedule an appointment for the pt. Advised that pt complete online NP registration. Asked if the appointment is to be marked as urgent. Per Dr Silva 1 month would be great, she is having issues with driving and her vision randomly turning off    They confirmed they will contact pt to schedule an appointment and try to find an appointment slot as soon as possible. Mychart message sent to pt with the information above.

## 2023-12-20 ENCOUNTER — Encounter (INDEPENDENT_AMBULATORY_CARE_PROVIDER_SITE_OTHER): Payer: Self-pay | Admitting: Family Medicine

## 2024-01-05 ENCOUNTER — Telehealth (INDEPENDENT_AMBULATORY_CARE_PROVIDER_SITE_OTHER): Payer: Self-pay | Admitting: Physician Assistant

## 2024-01-05 ENCOUNTER — Encounter (INDEPENDENT_AMBULATORY_CARE_PROVIDER_SITE_OTHER): Payer: Self-pay | Admitting: Family Medicine

## 2024-01-05 ENCOUNTER — Ambulatory Visit (INDEPENDENT_AMBULATORY_CARE_PROVIDER_SITE_OTHER): Admitting: Family Medicine

## 2024-01-05 VITALS — BP 127/81 | HR 114 | Temp 98.6°F | Ht 66.73 in | Wt 254.4 lb

## 2024-01-05 DIAGNOSIS — K644 Residual hemorrhoidal skin tags: Secondary | ICD-10-CM

## 2024-01-05 DIAGNOSIS — M5442 Lumbago with sciatica, left side: Secondary | ICD-10-CM

## 2024-01-05 DIAGNOSIS — Z23 Encounter for immunization: Secondary | ICD-10-CM

## 2024-01-05 MED ORDER — HYDROCORTISONE (PERIANAL) 2.5 % EX CREA
TOPICAL_CREAM | Freq: Two times a day (BID) | CUTANEOUS | 0 refills | Status: AC
Start: 2024-01-05 — End: 2024-02-04

## 2024-01-05 MED ORDER — TIZANIDINE HCL 6 MG PO CAPS
6.0000 mg | ORAL_CAPSULE | Freq: Three times a day (TID) | ORAL | 2 refills | Status: DC
Start: 2024-01-05 — End: 2024-01-31

## 2024-01-05 MED ORDER — KETOROLAC TROMETHAMINE 30 MG/ML IJ SOLN
30.0000 mg | Freq: Once | INTRAMUSCULAR | Status: AC
Start: 2024-01-05 — End: 2024-01-05
  Administered 2024-01-05: 30 mg via INTRAMUSCULAR

## 2024-01-05 NOTE — Progress Notes (Signed)
 [facility]         Chief Complaint   Patient presents with    Sciatica     Reocurring Sciatic pain, requesting toradol inj. At home PT exercises is causing flare ups.      Subjective     History of Present Illness  Cheryl Mcneil is a 28 year old female with sciatica and a bulging disc who presents with exacerbation of back pain.    She experiences a resurgence of sciatica-related back pain, primarily on the left side, attributed to a bulging disc. She completed physical therapy in the past, which was beneficial, but symptoms have recurred due to inconsistent follow-up and self-management. Some exercises now cause irritation, and she seeks short-term relief. She has been unable to schedule further physical therapy appointments online and plans to address this in person.    She manages chronic pain with gabapentin 300 mg, which is minimally effective. She previously tried pregabalin, which provided more relief. She has a strong tolerance for pain, and previous muscle relaxers, including baclofen, were not effective.    She reports a persistent external hemorrhoid that fluctuates with her menstrual cycle, causing pain and itching. She has not used steroid creams due to concerns about skin thinning, given her frequent bathroom use related to ulcerative colitis. She manages symptoms with a homemade lidocaine  and witch hazel mixture and uses a bidet to maintain hygiene.    Review of Systems   Constitutional:  Negative for chills and fever.   Respiratory:  Negative for cough, shortness of breath and wheezing.    Cardiovascular:  Negative for chest pain and leg swelling.   Gastrointestinal:  Negative for abdominal pain, diarrhea, nausea and vomiting.   Musculoskeletal:  Positive for back pain.   Neurological:  Negative for weakness, light-headedness, numbness and headaches.       Objective   BP 127/81 (BP Site: Right arm, Patient Position: Sitting, Cuff Size: Large)   Pulse (!) 114   Temp 98.6  F (37 C) (Temporal)   Ht 1.695 m (5' 6.73)   Wt 115.4 kg (254 lb 6.4 oz)   LMP 12/22/2023 (Approximate)   SpO2 95%   BMI 40.17 kg/m   Physical Exam  Nursing note reviewed.   Constitutional:       Appearance: Normal appearance.   HENT:      Mouth/Throat:      Mouth: Mucous membranes are moist.   Eyes:      Extraocular Movements: Extraocular movements intact.   Pulmonary:      Effort: Pulmonary effort is normal.   Neurological:      Mental Status: She is alert.   Psychiatric:         Mood and Affect: Mood normal.       Physical Exam       Results        Assessment/Plan     Assessment & Plan  Left-sided low back pain with left-sided sciatica, unspecified chronicity  Acute on chronic, will have pt receive toradol injection today and start on prn muscle relaxer. Cyclobenzaprine  has not helped significantly in the past.  Orders:    ketorolac (TORADOL) injection 30 mg    TiZANidine (ZANAFLEX) 6 MG capsule; Take 1 capsule (6 mg) by mouth 3 (three) times daily    Immunization due    Orders:    Flu vaccine, TRIVALENT, 6 months and older (FLUARIX/FLULAVAL/FLUZONE), single-dose PF, 0.5 mL    External hemorrhoid  Orders:    hydrocortisone  (ANUSOL -HC) 2.5 % rectal cream; Place rectally 2 (two) times daily      Verbal consent obtained to record this visit.

## 2024-01-05 NOTE — Telephone Encounter (Signed)
 Patient called in stating that insurance does not cover TiZANidine and requiring a PA.  Writer informed patient a message would be sent to MA and typically PA's can take 24-48 hours.    Thanks!

## 2024-01-05 NOTE — Progress Notes (Signed)
 Have you seen any specialists/other providers since your last visit with us ?    Yes, neurology.      The patient was informed that the following HM items are still outstanding:   Health Maintenance Due   Topic Date Due    Pap Smear  Never done    Influenza Vaccine  10/29/2023

## 2024-01-05 NOTE — Assessment & Plan Note (Addendum)
 Acute on chronic, will have pt receive toradol injection today and start on prn muscle relaxer. Cyclobenzaprine  has not helped significantly in the past.  Orders:    ketorolac (TORADOL) injection 30 mg    TiZANidine (ZANAFLEX) 6 MG capsule; Take 1 capsule (6 mg) by mouth 3 (three) times daily

## 2024-01-06 NOTE — Telephone Encounter (Signed)
 Delpha Lubinski ([name]) - [id number]  tiZANidine HCl 6MG  capsules  status: PA RequestCreated: October 8th, 2025 [phone]: October 9th, 2025

## 2024-01-10 ENCOUNTER — Encounter (INDEPENDENT_AMBULATORY_CARE_PROVIDER_SITE_OTHER): Payer: Self-pay | Admitting: Family Medicine

## 2024-01-12 ENCOUNTER — Telehealth (INDEPENDENT_AMBULATORY_CARE_PROVIDER_SITE_OTHER): Payer: Self-pay

## 2024-01-12 NOTE — Telephone Encounter (Signed)
 First Attempt: Tried dialing patient 2x but no answer. Left message informing patient our office is reaching out to reschedule her GI appointment with Aleck Cohn, NP on 01/24/2024 due to the provider being out of office this day. Informed patient to give us  a call back at our call center number at [phone] when available to reschedule her GI appointment with any of our available IBD providers to follow-up on her ulcerative colitis.

## 2024-01-13 ENCOUNTER — Encounter (INDEPENDENT_AMBULATORY_CARE_PROVIDER_SITE_OTHER): Payer: Self-pay

## 2024-01-13 NOTE — Telephone Encounter (Signed)
 Second Attempt: Tried dialing patient 2x again but still no answer. Left message again informing patient our office is reaching out to reschedule her GI appointment with Aleck Cohn, NP on 01/24/2024 due to the provider being out of office this day. Informed patient to give us  a call back at our call center number at [phone] when available to reschedule her GI appointment with any of our available IBD providers to follow-up on her ulcerative colitis.

## 2024-01-13 NOTE — Telephone Encounter (Addendum)
 Cheryl Mcneil ([name]) - [id number]  tiZANidine HCl 6MG  capsules  status: PA Response - ApprovedCreated: October 8th, 2025 [phone]: October 9th, 2025    After reviewing the prescription benefit plan's authorization guidelines for this drug, the request was approved for the following:   Approved Drug: tizanidine capsule     Strength: 6 mg     Quantity/Days: 30/10     Duration: 1 year   01/06/2024 - 01/05/2025    Pharmacy notified.

## 2024-01-13 NOTE — Telephone Encounter (Signed)
 Third Attempt: MyChart message sent.

## 2024-01-14 ENCOUNTER — Ambulatory Visit (INDEPENDENT_AMBULATORY_CARE_PROVIDER_SITE_OTHER): Admitting: Family Medicine

## 2024-01-14 ENCOUNTER — Encounter (INDEPENDENT_AMBULATORY_CARE_PROVIDER_SITE_OTHER): Payer: Self-pay | Admitting: Family Medicine

## 2024-01-14 VITALS — BP 138/87 | HR 93 | Temp 98.8°F | Wt 254.4 lb

## 2024-01-14 DIAGNOSIS — M9905 Segmental and somatic dysfunction of pelvic region: Secondary | ICD-10-CM

## 2024-01-14 DIAGNOSIS — M9900 Segmental and somatic dysfunction of head region: Secondary | ICD-10-CM

## 2024-01-14 DIAGNOSIS — M5442 Lumbago with sciatica, left side: Secondary | ICD-10-CM

## 2024-01-14 DIAGNOSIS — M9904 Segmental and somatic dysfunction of sacral region: Secondary | ICD-10-CM

## 2024-01-14 MED ORDER — TIZANIDINE HCL 2 MG PO CAPS
2.0000 mg | ORAL_CAPSULE | Freq: Two times a day (BID) | ORAL | 0 refills | Status: DC
Start: 2024-01-14 — End: 2024-01-20

## 2024-01-14 NOTE — Progress Notes (Signed)
 Have you seen any specialists/other providers since your last visit with us ?    No      The patient was informed that the following HM items are still outstanding:   Health Maintenance Due   Topic Date Due    Pap Smear  Never done

## 2024-01-14 NOTE — Progress Notes (Signed)
 Subjective:      Patient ID: Cheryl Mcneil is a 28 y.o. female     Chief Complaint   Patient presents with    OMT Treatment     Initial OMT visit, pt presenting with left sided low back pain with sciatica onset approximately 2 years.      HPI   Patient presents with low back pain for two years. Has left sided sciatica. Using the tizanidine which helps at night, not waking up from pain, but too tired during the day so would like a lower dose during that time. Just got a job today. Also having tmj pain.     The following portions of the patient's history were reviewed and updated as appropriate: allergies, current medications, past family history, past medical history, past social history, past surgical history, and problem list.    Current Medications:  Medications Taking[1]    Allergies:  Allergies[2]    ROS         BP 138/87 (BP Site: Left arm, Patient Position: Sitting, Cuff Size: Medium)   Pulse 93   Temp 98.8 F (37.1 C) (Temporal)   Wt 115.4 kg (254 lb 6.4 oz)   LMP 12/22/2023 (Approximate)   SpO2 98%   BMI 40.17 kg/m    Objective:   Physical Exam   - Right anterior innominate, L on L forward sacral torsion, bilateral tmj tightness      Assessment and Plan:   1. Left-sided low back pain with left-sided sciatica, unspecified chronicity    2. Somatic dysfunction of pelvis region    3. Somatic dysfunction of spine, sacral    4. Somatic dysfunction of head region     - Consent was obtained, patient was placed in the supine position and innominate treated with muscle energy, the lateral recumbent position and sacrum treated with muscle energy, and the seated position and tmj treated with soft tissue and fpr. Recommended application of moist heat on the area of pain multiple times a day for the next few days. A heating pad may be used. Also recommend warm showers with water pounding on the area of pain. Always make sure to check temperature of water or object before applying it to avoid burns.     Risk  & Benefits of any new medication(s) were explained to the patient who verbalized understanding & agreed to the treatment plan.     Butler Mcneil Sharps, DO             [1]   Outpatient Medications Marked as Taking for the 01/14/24 encounter (Office Visit) with Cheryl Butler JINNY, DO   Medication Sig Dispense Refill    hydrocortisone  (ANUSOL -HC) 2.5 % rectal cream Place rectally 2 (two) times daily 30 g 0    TiZANidine (ZANAFLEX) 6 MG capsule Take 1 capsule (6 mg) by mouth 3 (three) times daily 30 capsule 2   [2]   Allergies  Allergen Reactions    Cat Dander     Other      Tumeric and polllon

## 2024-01-19 ENCOUNTER — Telehealth (INDEPENDENT_AMBULATORY_CARE_PROVIDER_SITE_OTHER): Payer: Self-pay | Admitting: Family Medicine

## 2024-01-19 NOTE — Telephone Encounter (Signed)
 Pt was told by her pharmacy that a PA request for the medication TiZANidine (ZANAFLEX) 2 MG capsule (Order [id number]) was sent to the office. Pt calling for an update Please advise

## 2024-01-20 ENCOUNTER — Other Ambulatory Visit (INDEPENDENT_AMBULATORY_CARE_PROVIDER_SITE_OTHER): Payer: Self-pay | Admitting: Family Medicine

## 2024-01-20 DIAGNOSIS — M5442 Lumbago with sciatica, left side: Secondary | ICD-10-CM

## 2024-01-20 NOTE — Telephone Encounter (Signed)
 Patient requesting a temp refill for one medication until she is able to come in for her visit on 11/03 for OMT.

## 2024-01-21 ENCOUNTER — Ambulatory Visit (INDEPENDENT_AMBULATORY_CARE_PROVIDER_SITE_OTHER): Admitting: Family Medicine

## 2024-01-21 MED ORDER — TIZANIDINE HCL 2 MG PO CAPS
2.0000 mg | ORAL_CAPSULE | Freq: Two times a day (BID) | ORAL | 0 refills | Status: DC
Start: 2024-01-21 — End: 2024-01-31

## 2024-01-24 ENCOUNTER — Ambulatory Visit (INDEPENDENT_AMBULATORY_CARE_PROVIDER_SITE_OTHER)

## 2024-01-24 NOTE — Telephone Encounter (Signed)
 Submitted PA and awaiting determination for coverage.    (KeyBETHA BLAND)  Rx #: [id number]

## 2024-01-24 NOTE — Telephone Encounter (Addendum)
 PA initiated for tiZANidine HCl 2MG  capsules. Awaiting confirmation on medication directions prior to submitting for pt's plan limitation.

## 2024-01-26 ENCOUNTER — Telehealth (INDEPENDENT_AMBULATORY_CARE_PROVIDER_SITE_OTHER): Payer: Self-pay | Admitting: Family Medicine

## 2024-01-26 NOTE — Telephone Encounter (Signed)
 Copied from CRM [id number]. Topic: Clinical Support - Prescription Refill  >> Jan 26, 2024  3:33 PM Asjad S wrote:  Patrone, Ynez J called about Clinical Support - Prescription Refill.  Additional details:  Name, strength, directions of requested refill(s):    TiZANidine (ZANAFLEX) 6 MG capsule   How much medication is remaining:     NONE     Pharmacy to send refill to or patient to pick up rx from office (mark requested pharmacy in BOLD):    @PREFPHARMACY @  [facility] [address], [place] - [address]  [address] [place] [address]  Phone: [phone]  Fax: [phone]        Please mark X next to the preferred call back number:    Mobile: [phone] (mobile)   Home: [phone] (home)   Work: @WORKPHONE @       Medication refill request, see above. Thank you   Patient has been informed that medication refill requests should be called in up to one week prior to running out of medication.    Additional Notes: Patient is running out of medication please refill as soon as possible as she in lots of pain and need this medication to be send to the pharmacy    Next Visit: MM/DD/YYYY

## 2024-01-30 ENCOUNTER — Encounter (INDEPENDENT_AMBULATORY_CARE_PROVIDER_SITE_OTHER): Payer: Self-pay | Admitting: Family Medicine

## 2024-01-31 ENCOUNTER — Telehealth (INDEPENDENT_AMBULATORY_CARE_PROVIDER_SITE_OTHER): Payer: Self-pay | Admitting: Family Medicine

## 2024-01-31 ENCOUNTER — Encounter (INDEPENDENT_AMBULATORY_CARE_PROVIDER_SITE_OTHER): Payer: Self-pay | Admitting: Family Medicine

## 2024-01-31 ENCOUNTER — Ambulatory Visit (FREE_STANDING_LABORATORY_FACILITY): Admitting: Family Medicine

## 2024-01-31 VITALS — BP 127/82 | HR 114 | Temp 97.4°F | Ht 66.54 in | Wt 252.6 lb

## 2024-01-31 DIAGNOSIS — M9900 Segmental and somatic dysfunction of head region: Secondary | ICD-10-CM

## 2024-01-31 DIAGNOSIS — F3281 Premenstrual dysphoric disorder: Secondary | ICD-10-CM

## 2024-01-31 DIAGNOSIS — M5442 Lumbago with sciatica, left side: Secondary | ICD-10-CM

## 2024-01-31 DIAGNOSIS — M9904 Segmental and somatic dysfunction of sacral region: Secondary | ICD-10-CM

## 2024-01-31 DIAGNOSIS — M9905 Segmental and somatic dysfunction of pelvic region: Secondary | ICD-10-CM

## 2024-01-31 LAB — FOLLICLE STIMULATING HORMONE: Follicle Stimulating Hormone: 5.43 m[IU]/mL

## 2024-01-31 LAB — THYROID STIMULATING HORMONE (TSH) WITH REFLEX TO FREE T4: TSH: 0.47 u[IU]/mL (ref 0.35–4.94)

## 2024-01-31 LAB — LUTEINIZING HORMONE: LH: 2.2 m[IU]/mL

## 2024-01-31 LAB — ESTRADIOL: Estradiol: 27 pg/mL

## 2024-01-31 MED ORDER — TIZANIDINE HCL 2 MG PO CAPS
2.0000 mg | ORAL_CAPSULE | Freq: Two times a day (BID) | ORAL | 0 refills | Status: DC
Start: 2024-01-31 — End: 2024-02-18

## 2024-01-31 MED ORDER — KETOROLAC TROMETHAMINE 30 MG/ML IJ SOLN
30.0000 mg | Freq: Once | INTRAMUSCULAR | Status: AC
Start: 2024-01-31 — End: 2024-01-31
  Administered 2024-01-31: 30 mg via INTRAMUSCULAR

## 2024-01-31 MED ORDER — TIZANIDINE HCL 6 MG PO CAPS
6.0000 mg | ORAL_CAPSULE | Freq: Three times a day (TID) | ORAL | 2 refills | Status: AC
Start: 2024-01-31 — End: ?

## 2024-01-31 NOTE — Progress Notes (Signed)
 Have you seen any specialists/other providers since your last visit with us ?    No      The patient was informed that the following HM items are still outstanding:   Health Maintenance Due   Topic Date Due    Pap Smear  Never done

## 2024-01-31 NOTE — Telephone Encounter (Signed)
 Patient stated during today's visit you recommended she schedule an appointment with a cardiologist. If that is so can you please place an order and I can call to set up the appointment.

## 2024-01-31 NOTE — Progress Notes (Signed)
 Ketorolac 30 mg/ml Vb. Estelle Caraway.

## 2024-01-31 NOTE — Telephone Encounter (Signed)
 PA Initiated (Key: [id number])  TiZANidine (ZANAFLEX) 2 MG capsule   Waiting on Insurance response

## 2024-02-01 LAB — DEHYDROEPIANDROSTERONE (DHEA) SULFATE: DHEA-Sulfate: 192 ug/dL (ref 18–391)

## 2024-02-01 NOTE — Telephone Encounter (Signed)
 Duplicate PA request.    Original PA initiated and approved in another encounter from 01/19/24.

## 2024-02-01 NOTE — Progress Notes (Signed)
 Relayed PA approval and directions to pharmacy in another encounter from 01/19/24.

## 2024-02-01 NOTE — Telephone Encounter (Addendum)
 PA for tiZANidine HCl 2MG  capsules has been APPROVED.    Message from plan:  Outcome: Approved today by [employer]  Your PA request has been approved. Additional information will be provided in the approval communication. (Message 1145)  Effective Date: 02/01/2024  Authorization Expiration Date: 07/31/2024.    Called pt's pharmacy and spoke to [facility] who confirmed PA approval has been received. Confirmed directions of medication to pharmacy per Dr. Claudene instruction advising it's 6mg  at night and 2mg  during the day, please explain that to the pharmacy. Pharmacy verbalized understanding and confirmed pt was able to pick up her medication today.

## 2024-02-09 ENCOUNTER — Ambulatory Visit (FREE_STANDING_LABORATORY_FACILITY): Admitting: Family Medicine

## 2024-02-09 DIAGNOSIS — M357 Hypermobility syndrome: Secondary | ICD-10-CM

## 2024-02-09 DIAGNOSIS — E611 Iron deficiency: Secondary | ICD-10-CM

## 2024-02-09 DIAGNOSIS — M5442 Lumbago with sciatica, left side: Secondary | ICD-10-CM

## 2024-02-09 DIAGNOSIS — M9905 Segmental and somatic dysfunction of pelvic region: Secondary | ICD-10-CM

## 2024-02-09 DIAGNOSIS — M9906 Segmental and somatic dysfunction of lower extremity: Secondary | ICD-10-CM

## 2024-02-09 DIAGNOSIS — F321 Major depressive disorder, single episode, moderate: Secondary | ICD-10-CM

## 2024-02-09 DIAGNOSIS — M9903 Segmental and somatic dysfunction of lumbar region: Secondary | ICD-10-CM

## 2024-02-09 DIAGNOSIS — E559 Vitamin D deficiency, unspecified: Secondary | ICD-10-CM

## 2024-02-09 DIAGNOSIS — Z8349 Family history of other endocrine, nutritional and metabolic diseases: Secondary | ICD-10-CM

## 2024-02-09 DIAGNOSIS — M9900 Segmental and somatic dysfunction of head region: Secondary | ICD-10-CM

## 2024-02-09 DIAGNOSIS — M9904 Segmental and somatic dysfunction of sacral region: Secondary | ICD-10-CM

## 2024-02-09 MED ORDER — KETOROLAC TROMETHAMINE 30 MG/ML IJ SOLN
30.0000 mg | Freq: Once | INTRAMUSCULAR | Status: AC
Start: 2024-02-09 — End: 2024-02-09
  Administered 2024-02-09: 30 mg via INTRAMUSCULAR

## 2024-02-09 NOTE — Progress Notes (Unsigned)
 Subjective:      Patient ID: Cheryl Mcneil is a 28 y.o. female     No chief complaint on file.     HPI   Patient presents with low back pain for two years. A little worse on the right side today as she had a fall on the longboard. Jaw is doing better and pain is overall improving.     The following portions of the patient's history were reviewed and updated as appropriate: allergies, current medications, past family history, past medical history, past social history, past surgical history, and problem list.    Current Medications:  Medications Taking[1]    Allergies:  Allergies[2]    ROS         LMP 01/22/2024 (Approximate)    Objective:   Physical Exam   - Right anterior innominate, R on R forward sacral torsion, bilateral tmj tightness, tight piriformis bilaterally, [id number]      Assessment and Plan:   1. Left-sided low back pain with left-sided sciatica, unspecified chronicity  - ketorolac (TORADOL) injection 30 mg    2. Somatic dysfunction of pelvis region    3. Somatic dysfunction of spine, sacral    4. Somatic dysfunction of head region    5. Hypermobility syndrome  - Referral to Cardiology [facility]); Future    6. Family history of thyroid disease  - Thyroid Peroxidase Antibody; Future  - Thyroglobulin Antibody; Future  - Thyroid Stimulating Immunoglobulin; Future  - Thyrotropin Receptor Antibody (TRAb); Future  - Iodine; Future  - Thyroid Peroxidase Antibody  - Thyroglobulin Antibody  - Thyroid Stimulating Immunoglobulin  - Thyrotropin Receptor Antibody (TRAb)  - Iodine    7. Severe obesity (CMS/HCC)    8. Current moderate episode of major depressive disorder, unspecified whether recurrent (CMS/HCC)    9. Iron deficiency  - Ferritin; Future  - Iron; Future  - Transferrin; Future  - Iron Profile; Future  - Ferritin  - Transferrin  - Iron Profile    10. Vitamin D deficiency  - Vitamin D, 25 OH, Total; Future  - Vitamin D, 25 OH, Total    11. Somatic dysfunction of spine, lumbar    12. Somatic  dysfunction of lower extremity     - Consent was obtained, patient was placed in the supine position and innominate treated with muscle energy, the lateral recumbent position and sacrum treated with muscle energy, the supine position and lumbar treated with soft tissue and fpr and the piriformis treated with counterstrain and the seated position and tmj treated with soft tissue and fpr. Recommended application of moist heat on the area of pain multiple times a day for the next few days. A heating pad may be used. Also recommend warm showers with water pounding on the area of pain. Always make sure to check temperature of water or object before applying it to avoid burns.     Risk & Benefits of any new medication(s) were explained to the patient who verbalized understanding & agreed to the treatment plan.     [name], DO               [1]   No outpatient medications have been marked as taking for the 02/09/24 encounter (Office Visit) with Sharps Butler JINNY, DO.     Current Facility-Administered Medications for the 02/09/24 encounter (Office Visit) with Sharps Butler JINNY, DO   Medication Dose Route Frequency Provider Last Rate Last Admin    [COMPLETED] ketorolac (TORADOL)  injection 30 mg  30 mg Intramuscular Once Claudene Butler PARAS, DO   30 mg at 02/09/24 1551   [2]   Allergies  Allergen Reactions    Cat Dander     Other      Tumeric and polllon

## 2024-02-09 NOTE — Progress Notes (Unsigned)
 Medication vb: Rosina Gatlin

## 2024-02-13 NOTE — Patient Instructions (Addendum)
 It was a pleasure to see you today.      Schedule the liver ultrasound and labs. We will discuss the results when you return unless there is something we need to act on.     Ask that all of your lab results be sent to me.     Use the suppositories at bedtime after soaking in a warm bathtub for 15-20 minutes as you need them.     Start mesalamine  right away - 4 capsules all together in the am.     Schedule an EGD and colonoscopy at the [facility].    I have sent MOVI PREP to your pharmacy. Please pick this up as soon as possible so that you are prepared.    Prep as we have discussed and as written below.    Follow up after your procedure as needed or as directed by your endoscopist.    Colonoscopy Preparation Instructions - Moviprep Bowel Preparation    If you are on blood thinners, (Eliquis, Pradaxa, Xarelto, other direct oral anticoagulants, Coumadin, Plavix, etc.) insulin or other diabetic medications, please let us  know and check with your prescribing physician for instructions. Your prescribing provider needs to determine if you should stop or stay on your blood thinner before procedure.  Not following these instructions may result in cancellation.      General Endoscopy Information  Do no chew gum or suck on hard candy the day of your procedure.  You must have a responsible adult to accompany you home after the procedure. This person must pick you up in the endoscopy unit. If you do not have a responsible adult to accompany you home, your procedure will be cancelled.   You may not operate a motor vehicle for the remainder of the day following your procedure.   You may not take a taxi, G[place]le or bus home unless accompanied by a responsible adult.   If your insurance company requires a REFERRAL, YOU MUST BRING IT WITH YOU. Also, please bring your current insurance card(s), copay (if applicable), and a current picture ID with you on the day of your procedure.   Our office will contact your insurance carrier to  verify coverage and, if required, obtain preauthorization for your procedure. However, preauthorization is not a guarantee of payment and you will be responsible for any deductibles, copays, co-insurance, and/or any other plan specific out-of-pocket expenses.   Dependent upon your family history, personal history, prior gastroenterology diagnoses, or findings discovered during your colonoscopy, your procedure may be considered preventative or diagnostic. This determination will not be made until after the procedure has concluded and will be based upon the findings of your exam. In our experience, many insurance carriers cover preventative and diagnostic colonoscopies differently, and as a result, your out-of-pocket payment may also differ. If you have any questions about your coverage, please contact your insurance carrier directly.   If you have any questions, please contact your GI physician's office during normal business hours.    Timeline of Your Colonoscopy Preparation    The Day Before Your Procedure   In the   Morning  Prepare the solution as instructed below and refrigerate after the solution has dissolved.       5:00 pm   Be sure to drink the additional 16oz of clear fluids as instructed.  Prepare the second container in the same way instructed above and refrigerate.   The Day of Your Procedure   6 hours   Prior to  Arrival Drink the second container and additional water as instructed above beginning 6 hours prior to your arrival time.   Preparation Tips  Since the bowel preparation will cause diarrhea, it is important to stay hydrated. Make a conscious effort to drink clear liquids throughout the day to prevent dehydration.  Try drinking the preparation with a straw as this may be easier to tolerate.  The bowel preparation will result in diarrhea so please plan appropriately.  If you start to feel nauseated, you can pause for 20-30 minutes and then begin drinking the solution more slowly. Some patients  say that walking around helps relieve their nausea.  Be sure to finish the entire prep or else the colon may not clean adequately.  Your output/bowel movements should be clear (or yellow) after you've completed the bowel preparation. If you are still seeing stool/brown after completing the 2nd dose, please call [phone] or [phone].    DO NOT FOLLOW THE PREP INSTRUCTIONS ON THE PRODUCT'S BOX OR THAT THE PHARMACY PROVIDES.   YOU MUST FOLLOW OUR PREP INSTRUCTIONS AS WRITTEN IN THIS DOCUMENT    * If you need to reschedule or cancel your appointment, please call your GI physician's office at [phone]), option [phone].      FREQUENTLY ASKED QUESTIONS:    My procedure is in the afternoon. Can I eat in the morning?   A: No. To ensure your safety during the procedure, it is important that the stomach is empty. Any food or liquid in the stomach at the time of the procedure places you at risk of aspirating these contents into the lung leading to a serious complication called aspiration pneumonia. You can drink clear liquids (except for no red liquids) until 4 hours before your procedure time. (Examples of clear liquids include water, coffee/tea w/ out milk, Gatorade, coke, sprite, apple juice)    I ate breakfast (lunch or dinner) the day before my colonoscopy. Is that okay?    A: If the preparation instructions were not followed properly, residual stool may remain in the colon and hide important findings from the examining physician. In some cases, if the colon preparation is not good, you may have to repeat the preparation and the exam. If you accidentally eat any solid food the day before your exam, please call the endoscopy unit where you are having your procedure. You may be asked to reschedule your procedure.     I don't have a ride. Is that okay?   A: No. If you do not have a responsible adult to accompany you home, YOUR PROCEDURE WILL BE CANCELLED.    How many days prior to the procedure should I  discontinue my blood thinning medications?   A: If you are taking any blood thinning medications other than aspirin, please let your endoscopist know. Please contact your prescribing physician prior to your procedure to discuss how long to hold these medications. You may continue to take aspirin.  Do NOT wait until the day before your procedure to have this discussion, as some blood thinners need to be held several days prior to the procedure.  Aspirin, NSAIDs, and fish oil are okay to continue taking. If you had a phone call with a preoperative nurse or a visit with a preoperative nurse practitioner follow the instructions they provided.    I am on blood pressure medication. Can I take it prior to my procedure?   A: The day prior to your procedure take your medications the way you  normally would. However, for those patients taking any type of bowel cleansing preparation, be advised that you may undergo a prolonged period of diarrhea that may flush oral medications out of your system before they have time to take effect. The morning of your procedure you should take any blood pressure or heart medications with a small sip of water. If you have questions about specific medication(s), please call your GI physician's office.    I am diabetic.   A: You must direct the question to the physician who placed you on diabetes medications. Please check your blood sugar the morning of your procedure as you normally would. If you have any questions about your diabetes management in conjunction with your fast for your endoscopic procedure, please consult your primary physician.  If you had a phone call with a preoperative nurse or a visit with a preoperative nurse practitioner, follow the instructions they provided.    I am on pain medication. Can I take it prior to my procedure?   A: You may take your prescription pain medications prior to your procedure with a small sip of water. Please inform the pre-procedural clinical staff  of any medications you've taken the day of your procedure. If you have any questions, please call your GI physician's office.    I'm having a menstrual period. Should I reschedule my colonoscopy appointment?   A: No. Your menstrual period will not interfere with your physician's ability to complete your procedure.     May I continue taking my iron tablets?   A: No. Iron may cause the formation of dark color stools which can make it difficult for the physician to complete your colonoscopy if your preparation is less than optimal. We recommend you stop taking your oral iron supplements 7 days prior to your procedure.     I am having a colonoscopy tomorrow. I started my colon preparation on time but now I am experiencing diarrhea and/or a bloating feeling. What should I do?   A: Nausea, vomiting and a sense of fullness or bloating can occur any time after beginning your colon preparation. However, it is important that you drink all the preparation. For most people, taking an hour break from the preparation will usually help. Then continue taking the preparation as ordered. If the vomiting returns or symptoms get worse, please call your GI physician's office.    If you have any questions or concerns, please don't hesitate to call [phone]).      Sincerely,     Gastroenterology Team        Please schedule a follow up appointment with me two weeks after your procedure to discuss the results and next steps.     Regards,  Sharlet GORMAN Bogaert, NP

## 2024-02-13 NOTE — Progress Notes (Addendum)
 Cheryl EDISON Marlane, NP  [address], [address]  [place], [place] [address]  [phone]    NEW PATIENT VISIT    Date of Visit: 02/14/2024 2:58 PM  Patient ID: Cheryl Mcneil is a 28 y.o. female.  Referring Physician: Silva Handler, MD       Reason for Consultation:   Ulcerative colitis/left sided  Hemorrhoids  To establish care  History:   Cheryl Mcneil is a 28 y.o. female with PMH of UC, Hepatic abscess resolved age 75, splenectomy, Depression, seasonal allergies, chronic pain and hypermobility, BMI 40    History of Present Illness  Cheryl Mcneil is a 28 year old female with left-sided Ulcerative Colitis who presents to establish care for evaluation of gastrointestinal symptoms and medication management.    She experiences occasional tenesmus, which she attributes to hemorrhoids, and denies any clear signs of GI bleeding. About a year ago, she had a flare with blood in her stool and increased intestinal cramping. Her history of left-sided colitis primarily affects the rectum. She was previously on Humira but stopped in 2022 or 2021 and she has not been treated since. Recently, she has not experienced significant colitis flares.  She typically has 2-3 bowel movements a day without urgency, except occasionally.    She recently has been experiencing increased nausea  and has a history of heartburn and indigestion. She has not previously taken PPIs but uses generic Pepcid  AC before her period to manage symptoms.     Her most recent colonoscopy was at [facility] 2-3 years ago.    She has a family history of Hashimoto's thyroiditis and is exploring potential hormonal issues with her gynecologist and PCP. She experiences extreme menstrual periods and suspects endometriosis, which she believes may contribute to her intestinal cramping.    Her past medical history includes a hepatic abscess at age 59, leading to hospitalization and surgical intervention, including a splenectomy.  She has chronic back issues and takes muscle relaxers for pain management.     Her medication regimen includes muscle relaxers for chronic pain and hypermobility management. She has tried mesalamine in the past without success and has used steroid cream for hemorrhoids, which provides inconsistent relief. Suppositories or enemas have not been helpful in the past.    She has history of back pain and arthralgias.     HISTORY:  Diagnosed with UC at age 40 and treated with mesalamine without benefit. At this time she also had a hepatic abscess and underwent a splenectomy. She was then treated with Humira until 4/22 because it lost efficacy.   Has not been on any medication since.  Colonoscopy 12/22 no sign  of disease per patient report.       Problem List:   Problem List[1]    Past Medical History:   Medical History[2]    Past Surgical History:   Past Surgical History[3]    Current Medications:   Medications Taking[4]    Allergies:   Allergies[5]    Family History:   Family History[6]    Social History:   Social History[7]    Review of Systems:   Review of Systems   Constitutional: Denies fever, chills or weight loss.  Eyes: Denies Blurred vision, eye discharge or eye pain.   Respiratory: Denies wheezing or cough.  Cardiovascular: Denies chest pain or palpitations.  Gastrointestinal: See HPI.  Neurologic: Denies slurred speech, hemiplegia or focal deficit.    Vital Signs:   BP 115/65 (BP Site:  Left arm, Patient Position: Sitting, Cuff Size: Large)   Pulse (!) 114   Ht 1.651 m (5' 5)   Wt 112.3 kg (247 lb 8 oz)   LMP 01/22/2024 (Approximate)   SpO2 97%   BMI 41.19 kg/m      Physical Exam:   Physical Exam   General appearance - Well developed and well nourished, no acute distress.  Eyes - Sclera anicteric, no pallor.  Respiratory - Non labored respirations, no audible wheezing.  Cardiovascular - Regular rate and rhythm, no JVD, no LE edema.  Gastrointestinal - Soft, non-tender, non-distended, no masses, normal  bowel sounds.   Neurologic - Alert and oriented to person, place and time.  No gross movement disorders noted.  Psychiatric: Appropriate affect.  Skin: Normal color and turgor, no rashes, no suspicious skin lesions noted.    Labs Reviewed:     Recent Labs     11/03/23  1405 08/07/21  1450   WBC 11.23* 10.24*   Hemoglobin 13.5  --    Hgb  --  13.6   Hematocrit 40.4 39.7   Platelet Count 552*  --    Platelets  --  462*       No results for input(s): PT, INR in the last 56199 hours.    Recent Labs     01/31/24  1534 11/03/23  1405 08/07/21  1450   Sodium  --  142 142   Potassium  --  4.5 4.3   Chloride  --  110 112*   CO2  --  25 24   BUN  --  14 9.0   Creatinine  --  0.8 0.8   Calcium  --  9.4 8.7   Albumin  --  4.0 3.5   Protein, Total  --  7.1 6.1   Bilirubin, Total  --  0.4 0.5   Alkaline Phosphatase  --  79 73   ALT  --  31 75*   AST (SGOT)  --  23 47*   Glucose  --  92 98   TSH 0.47  --   --         Rads:   No results found.    GI Procedures:     See HPI  Assessment & Plan:   1. 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 Left sided ulcerative colitis without complication (CMS/HCC)  - mesalamine (APRISO) 0.375 g 24 hr capsule; Take 4 capsules (1.5 g) by mouth once daily  Dispense: 120 capsule; Refill: 3  - polyethylene glycol w/ electrolytes (MOVIPREP) oral solution; Take 1 Container by mouth once for 1 dose Follow prep instructions provided by your Gastroenterologist  Dispense: 1 each; Refill: 0  - C Reactive Protein; Future  - C Reactive Protein    3. Gastroesophageal reflux disease, unspecified whether esophagitis present  - omeprazole (PriLOSEC) 40 MG capsule; Take 1 capsule (40 mg) by mouth once daily  Dispense: 30 capsule; Refill: 11    4. Tenesmus  - hydrocortisone  (ANUSOL -HC) 25 MG suppository; Place 1 suppository (25 mg)  rectally once daily  Dispense: 30 suppository; Refill: 11    5. Liver disease  - US  Abdomen Limited Liver; Future  - US  Abdomen Limited Liver       There are no discontinued medications.  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 No significant symptoms currently. Maintenance therapy considered to prevent flares.  - Prescribed Apriso (mesalamine) 4 pills in the morning for maintenance therapy.  - Scheduled colonoscopy and EGD to assess current status of colitis and upper gi tract symptoms.  - Provided Moviprep for bowel preparation prior to colonoscopy.    Hemorrhoids  Occasional tenesmus likely related to hemorrhoids, especially around menstrual periods. Long-term use of topical steroids not preferred.  - Prescribed Anusol  suppositories for use as needed, especially around menstrual periods.  - Advised soaking in a warm bathtub for 15-20 minutes before using suppositories.    Gastroesophageal reflux disease (GERD)  Symptoms of heartburn and indigestion. Omeprazole prescribed to reduce stomach acid and alleviate symptoms.  - Prescribed omeprazole with 30-day supply and 11 refills.  - Advised to monitor response to omeprazole and adjust dosage if necessary.    Intermittent nausea  Possibly related to GERD or hormonal issues. Omeprazole may help reduce nausea by decreasing stomach acid.  - Prescribed omeprazole to potentially alleviate nausea.  - Monitor response to omeprazole and adjust treatment as needed.    Return if symptoms worsen or fail to improve.    It is a pleasure to participate in the care of Daje J Giovannetti. If you have any questions or concerns, please feel free to contact us  at [phone].    Cheryl GORMAN Bogaert, NP    Total time  spent on this encounter 54 minutes.    This includes the time spent reviewing medical records, other provider notes, prior  imaging and GI procedures +/- pathology reports, prior to/during/right after the encounter as well as face to face time with the patient and time spent discussing the care with other physicians/providers.                  [1]   Patient Active Problem List  Diagnosis    UC (ulcerative colitis) (CMS/HCC)    Arthralgia    Knee pain    H/O endoscopy    Chronic ulcerative colitis with complication, unspecified location (CMS/HCC)    Anxiety    Current moderate episode of major depressive disorder, unspecified whether recurrent (CMS/HCC)    Low back pain with sciatica, sciatica laterality unspecified, unspecified back pain laterality, unspecified chronicity    Hx of splenectomy    Hepatic abscess    Abnormal results of liver function studies    Severe obesity (CMS/HCC)   [2]   Past Medical History:  Diagnosis Date    Abnormal Pap smear of cervix Last pap 3 years ago    I have been positive for HPV before    Anxiety     Concussion 03/30/2012    Depression     Disorder of thyroid     Possibly. Getting fill thyroid workup today    Human papilloma virus infection     IBS (irritable bowel syndrome)     PMDD (premenstrual dysphoric disorder)     Seasonal allergies     Ulcerative colitis (CMS/HCC)    [3]   Past Surgical History:  Procedure Laterality Date    APPENDECTOMY (OPEN)      SPLENECTOMY, TOTAL      WISDOM TOOTH EXTRACTION  07/28/2012    All 4 tooth   [4]   Outpatient Medications Marked as Taking for the 02/14/24 encounter (Office Visit) with Bogaert Cheryl GORMAN,  NP   Medication Sig Dispense Refill    famotidine  (PEPCID ) 10 MG tablet Take 1 tablet (10 mg) by mouth 2 (two) times daily      fexofenadine (ALLEGRA ODT) 30 MG disintegrating tablet Dissolve 1 tablet (30 mg) in the mouth 2 (two) times daily      TiZANidine (ZANAFLEX) 2 MG capsule Take 1 capsule (2 mg) by mouth 2 (two) times daily She will take 6mg  at night and 2mg  during the day 60 capsule 0    TiZANidine (ZANAFLEX) 6 MG capsule Take 1 capsule (6 mg) by mouth 3  (three) times daily 30 capsule 2   [5]   Allergies  Allergen Reactions    Cat Dander     Other      Tumeric and polllon   [6]   Family History  Problem Relation Name Age of Onset    Thyroid disease Father      Diverticulitis Father      Heart disease Maternal Uncle          Pace marker    Diabetes Maternal Grandmother      Heart disease Maternal Grandmother      Heart disease Maternal Grandfather      Diabetes Paternal Grandmother      Thyroid disease Paternal Grandmother      Diabetes Paternal Grandfather     [7]   Social History  Tobacco Use    Smoking status: Never    Smokeless tobacco: Never   Vaping Use    Vaping status: Never Used   Substance Use Topics    Alcohol use: Yes     Alcohol/week: 0.0 - 1.0 standard drinks of alcohol     Comment: socially twice a month    Drug use: Yes     Frequency: 7.0 times per week     Types: Marijuana

## 2024-02-14 ENCOUNTER — Encounter (INDEPENDENT_AMBULATORY_CARE_PROVIDER_SITE_OTHER): Payer: Self-pay | Admitting: Nurse Practitioner

## 2024-02-14 ENCOUNTER — Ambulatory Visit (INDEPENDENT_AMBULATORY_CARE_PROVIDER_SITE_OTHER): Admitting: Nurse Practitioner

## 2024-02-14 ENCOUNTER — Other Ambulatory Visit (INDEPENDENT_AMBULATORY_CARE_PROVIDER_SITE_OTHER): Payer: Self-pay | Admitting: Nurse Practitioner

## 2024-02-14 ENCOUNTER — Other Ambulatory Visit (INDEPENDENT_AMBULATORY_CARE_PROVIDER_SITE_OTHER)

## 2024-02-14 VITALS — BP 115/65 | HR 114 | Ht 65.0 in | Wt 247.5 lb

## 2024-02-14 DIAGNOSIS — K5120 Ulcerative (chronic) proctitis without complications: Secondary | ICD-10-CM

## 2024-02-14 DIAGNOSIS — K5150 Left sided colitis without complications: Secondary | ICD-10-CM

## 2024-02-14 DIAGNOSIS — Z79899 Other long term (current) drug therapy: Secondary | ICD-10-CM

## 2024-02-14 DIAGNOSIS — E611 Iron deficiency: Secondary | ICD-10-CM

## 2024-02-14 DIAGNOSIS — K219 Gastro-esophageal reflux disease without esophagitis: Secondary | ICD-10-CM

## 2024-02-14 DIAGNOSIS — R198 Other specified symptoms and signs involving the digestive system and abdomen: Secondary | ICD-10-CM

## 2024-02-14 DIAGNOSIS — Z8349 Family history of other endocrine, nutritional and metabolic diseases: Secondary | ICD-10-CM

## 2024-02-14 DIAGNOSIS — E559 Vitamin D deficiency, unspecified: Secondary | ICD-10-CM

## 2024-02-14 DIAGNOSIS — K769 Liver disease, unspecified: Secondary | ICD-10-CM

## 2024-02-14 DIAGNOSIS — K51019 Ulcerative (chronic) pancolitis with unspecified complications: Secondary | ICD-10-CM

## 2024-02-14 LAB — TRANSFERRIN: Transferrin: 318 mg/dL (ref 174–382)

## 2024-02-14 LAB — C-REACTIVE PROTEIN: C-Reactive Protein: 0.3 mg/dL (ref 0.0–1.1)

## 2024-02-14 LAB — IRON PROFILE
Iron Saturation: 15 % (ref 15–50)
Iron: 52 ug/dL (ref 32–157)
TIBC: 337 ug/dL (ref 265–497)
UIBC: 285 ug/dL (ref 126–382)

## 2024-02-14 LAB — VITAMIN D, 25 OH, TOTAL: Vitamin D 25-OH, Total: 45 ng/mL (ref 30–100)

## 2024-02-14 LAB — SEDIMENTATION RATE: Sed Rate: 13 mm/h (ref <=20)

## 2024-02-14 LAB — FERRITIN: Ferritin: 23.8 ng/mL (ref 4.60–204.00)

## 2024-02-14 MED ORDER — HYDROCORTISONE ACETATE 25 MG RE SUPP
25.0000 mg | Freq: Every day | RECTAL | 11 refills | Status: DC
Start: 2024-02-14 — End: 2025-02-13

## 2024-02-14 MED ORDER — OMEPRAZOLE 40 MG PO CPDR
40.0000 mg | DELAYED_RELEASE_CAPSULE | Freq: Every day | ORAL | 11 refills | Status: AC
Start: 2024-02-14 — End: 2025-02-13

## 2024-02-14 MED ORDER — PEG-KCL-NACL-NASULF-NA ASC-C 100 G PO SOLR
1.0000 | Freq: Once | ORAL | 0 refills | Status: DC
Start: 2024-02-14 — End: 2024-02-17

## 2024-02-14 MED ORDER — MESALAMINE ER 0.375 G PO CP24
1500.0000 mg | ORAL_CAPSULE | Freq: Every day | ORAL | 3 refills | Status: AC
Start: 2024-02-14 — End: 2024-05-14

## 2024-02-15 ENCOUNTER — Ambulatory Visit (INDEPENDENT_AMBULATORY_CARE_PROVIDER_SITE_OTHER): Admitting: Nurse Practitioner

## 2024-02-15 ENCOUNTER — Ambulatory Visit (INDEPENDENT_AMBULATORY_CARE_PROVIDER_SITE_OTHER): Admitting: Family Medicine

## 2024-02-16 ENCOUNTER — Encounter (INDEPENDENT_AMBULATORY_CARE_PROVIDER_SITE_OTHER): Payer: Self-pay | Admitting: Family Nurse Practitioner

## 2024-02-16 ENCOUNTER — Ambulatory Visit (INDEPENDENT_AMBULATORY_CARE_PROVIDER_SITE_OTHER): Admitting: Family Nurse Practitioner

## 2024-02-16 ENCOUNTER — Ambulatory Visit: Attending: Family Nurse Practitioner | Admitting: Family Nurse Practitioner

## 2024-02-16 ENCOUNTER — Telehealth (INDEPENDENT_AMBULATORY_CARE_PROVIDER_SITE_OTHER): Payer: Self-pay

## 2024-02-16 VITALS — BP 106/71 | HR 79 | Temp 97.8°F | Ht 65.0 in | Wt 250.0 lb

## 2024-02-16 DIAGNOSIS — Z01419 Encounter for gynecological examination (general) (routine) without abnormal findings: Secondary | ICD-10-CM

## 2024-02-16 DIAGNOSIS — Z23 Encounter for immunization: Secondary | ICD-10-CM

## 2024-02-16 DIAGNOSIS — R32 Unspecified urinary incontinence: Secondary | ICD-10-CM

## 2024-02-16 DIAGNOSIS — Z113 Encounter for screening for infections with a predominantly sexual mode of transmission: Secondary | ICD-10-CM

## 2024-02-16 DIAGNOSIS — R102 Pelvic and perineal pain unspecified side: Secondary | ICD-10-CM

## 2024-02-16 LAB — HEPATITIS C ANTIBODY, TOTAL: Hepatitis C Antibody: NONREACTIVE

## 2024-02-16 LAB — THYROGLOBULIN ANTIBODY: Thyroglobulin Antibodies: <1 [IU]/mL (ref <=1)

## 2024-02-16 LAB — SYPHILIS SCREEN, IGG AND IGM: Syphilis Screen IgG and IgM: NONREACTIVE

## 2024-02-16 LAB — THYROTROPIN RECEPTOR ANTIBODY (TRAB): Thyrotropin Receptor AB: <1.1 IU/L (ref 0.00–1.75)

## 2024-02-16 LAB — THYROID PEROXIDASE ANTIBODY: Thyroid Peroxidase (TPO) Antibodies: <1 [IU]/mL (ref <9)

## 2024-02-16 LAB — THYROID STIMULATING IMMUNOGLOBULIN: Thyroid Stimulating Immunoglobulin: <89 %{baseline} (ref <140)

## 2024-02-16 LAB — HIV-1/2, ANTIGEN AND ANTIBODY WITH REFLEX TO CONFIRMATION: HIV Ag/Ab 4th Generation: NONREACTIVE

## 2024-02-16 LAB — HEPATITIS B (HBV) SURFACE ANTIGEN WITH REFLEX TO CONFIRMATION: Hepatitis B Surface Antigen: NONREACTIVE

## 2024-02-16 NOTE — Patient Instructions (Addendum)
 VULVAR SKIN CARE:  Use of detergent free of dyes and fragrances. Use 1/3 to 1/2 the suggested amount per load. Consider an extra rinse cycle for your underwear.   Do not use a fabric softener or static-reducing dryer sheets. If you use a stain-remover on towels or underwear, soak and rinse in clear water, then wash in a regular wash cycle.   Wear white, all cotton underwear, not nylon with a cotton crotch. Cotton allows air in and moisture out.   Avoid pantyhose. If you must wear them, either cut out the diamond crotch ( if you cut out the crotch be sure to leave about 1/4 to 1/2 inch of fabric from the seam to prevent running) or wear thigh high hose.   Avoid thongs  Avoid tight clothing, especially clothing made of synthetic fabrics. Remove wet swimsuits and exercise clothing as soon as you can.   Avoid bath soaps that are scented, including deodorant soaps. Avoid all liquid body wash. Wash with water. If you must use soap, consider Dove Sensitive Skin or other sensitive-skin bar soap.   Avoid all bubble baths, bath salts and scented oils  Do not scrub vulvar skin with a loofah or scrub brush. Use your hand or a soft washcloth  Pat dry rather than rubbing with a towel. Or use a hairdryer on a cool setting to dry the vulva.   Use white, unscented toilet paper  No wipes, No sprays! Avoid all feminine hygiene sprays, perfumes, adult or baby wipes. Squirt lukewarm water over the vulva (you can use a sports water bottle) after urinating if urine causes burning of the skin. Pat dry rather than rubbing with a towel  Avoid the use of deodorized pads and tampons. Don't use pant liners on days that you're not bleeding. Change underwear rather than wearing liners. Liners and pads can cause chafing and irritation  Avoid medicated over-the counter creams and ointments, especially antibiotic ointments and Vagisil.   DO NOT DOUCHE. Douching alters your normal bacterial mix in the vagina and can increase the risk of infection.    DO NOT SHAVE OR WAX the vulvar area if you irritated or sensitive skin      WHAT HELPS?    Cold packs help itching. For itching, apply a cold pack for 5 to 10 minutes to the area. You can use a blue gel pack that you keep in the refrigerator or freezer. If it is stored in the freezer, wrap it with a soft cloth first, and don't leave it on too long!    Tub soaks. Soak in lukewarm (not hot) bath water with 4-5 tablespoons of baking soda to help soothe vulvar itching and burning. Or you can use Aveeno Oatmeal Bath treatments. Soak 1 to 3 times a day for 10-15 minutes.     Lubrication for sex: If you are using a condom, use a water-based lubricant like Uberlube, or Slippery Stuff (available at [employer]). If you are not using condoms, an oil such as olive oil, mineral oil, or coconut oil works well for lubrication (oil can make condoms less effective!).    Emollients such as Vitamin A and D ointment (generic available), Aquaphor, Vaseline, CeraVe Soothing Eczema Creamy oil, Emuaid, or coconut oil can be soothing, prevent excessive drying of the skin and may help itching. Choose one, and if it doesn't burn, consider using it a couple times a day or as needed. These are all available without a prescription (A and D in the baby  aisle, Aquaphor in dry skin aisle, coconut in food stores such as [employer] or [employer]). You can also find Herbal Savvy or Vital V Wild Yam Salve on [employer]. These are a bit more expensive. If you have allergies, check the ingredients on [employer] before ordering.       Dr. Ozell Veldon [facility] Obstetrics [facility] Gynecology  [address]  [place], [place] [address]    Phone [phone]  Fax [phone]    Ozell Veldon, DO  [facility]

## 2024-02-16 NOTE — Progress Notes (Signed)
 Chief Complaint: annual exam     History of Present Illness: Cheryl Mcneil (new patient) is a 28 y.o. female, No obstetric history on file., Patient's last menstrual period was 01/22/2024 (approximate). presenting for annual exam.  Her menstrual cycles are regular, lasting , super painful. Reports failing all IUDs, Nexplanon makes her depressed and COCP causes low libido which has been damaging to her relationship. Uses spermicide  and pull out method. Reports she believes she has PMDD. Use herbs such as Wild Yam and Chased Aldine and Pepcid  two weeks prior to cycle, helps improve. Allegra as well. Patient reports Paragard bleeding was severe and Mirena IUD was displaced     Patient tearful in office today. Had history of laparoscopic surgery and had traumatic experience. Has history of UC. Receiving treatment. Working up for [employer], has strong family history. History of open appendectomy when she was 28 years old.    Reports urinary incontinence occasionally   History of chronic BV and recent ureaplasma she was treated for in June    She is sexually active with one partner and denies any pain or dryness with intercourse.   Desires STI screening today.     No known family history of breast, ovarian or endometrial cancer.    Gynecologic History:   Has had HPV vaccination : yes- did not complete. Desires today   Contraception : spermicide, family planning   h/o STDs- denies  h/o abnormal pap smears- yes, HPV + 3-4 years ago  Last Pap Smear- unsure    Obstetric History:   OB History   No obstetric history on file.       Past Medical History:  Medical History[1]        Past Surgical History:  Past Surgical History[2]    Social History:  Social History[3]    Family History:  Family History[4]    Allergies: Allergies[5]    Medications: Current Medications[6]    Review of Systems:  General ROS: negative for fatigue, fever/chills, weight loss  Breast ROS: negative for breast lumps, nipple  discharge  Gastrointestinal ROS: no abdominal pain, change in bowel habits  Genitourinary ROS: no dysuria, trouble voiding, or hematuria  Skin: denies rash or lesion  Psych: denies depression, anxiety  All other systems reviewed and are negative       Physical Exam:   BP 106/71   Pulse 79   Temp 97.8 F (36.6 C)   Ht 5' 5 (1.651 m)   Wt 250 lb (113.4 kg)   LMP 01/22/2024 (Approximate)   BMI 41.60 kg/m   General appearance - alert, well appearing, and in no distress  Respiratory - normal breathing pattern, normal appearance and symmetric expansion of chest wall  Cardiovascular: Regular Rate   Breast Exam: normal exam without masses, skin changes, nipple discharge, or palpable nodes  Abdomen - soft, nontender, nondistended, no masses  Pelvic exam:  VULVA: normal appearing vulva with no masses, tenderness or lesions, normal clitoris  VAGINA: normal appearing vagina with normal color and discharge, no lesions  CERVIX: normal appearing cervix without discharge or lesions.   UTERUS: normal single, nontender  ADNEXA:  nontender and no masses.  RECTAL: normal external  Neurologic: mental status: alert and oriented x 3    Assessment:  1. Well woman exam with routine gynecological exam    2. Pelvic pain in female    3. Urinary incontinence, unspecified type    4. Routine screening for STI (sexually transmitted infection)    5.  Need for HPV vaccine          Plan:  - discussed self breast awareness  - TVUS order given to patient  - STD panel done today  - HPV dose #1 today  - Regular exercise, healthy lifestyle recommendations  - Discussed contraceptive options, pt declines today. Discussed endometriosis diagnosis and treatment. AVS on Dr. Veldon provided if she desires surgical consult. Has failed out of multiple contraception options  - Encouraged continued use of condoms for STI prevention if not in a monogamous relationship.   - Pap done today  - Referral to pelvic PT provided  - Instructed to call if she notes  irregular vaginal bleeding or other concerning findings.  - All questions answered    - Encouraged to follow up with PCP for other HCM  - Follow up 1 year for well woman or sooner as indicated    Mariano Levo, FNP-C  [facility]         [1]   Past Medical History:  Diagnosis Date    Abnormal Pap smear of cervix Last pap 3 years ago    I have been positive for HPV before    Anxiety     Concussion 03/30/2012    Depression     Disorder of thyroid     Possibly. 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     PMDD (premenstrual dysphoric disorder)     Seasonal allergies     Ulcerative colitis (CMS/HCC)    [2]   Past Surgical History:  Procedure Laterality Date    APPENDECTOMY (OPEN)      SPLENECTOMY, TOTAL      WISDOM TOOTH EXTRACTION  07/28/2012    All 4 tooth   [3]   Social History  Socioeconomic History    Marital status: Single   Tobacco Use    Smoking status: Never    Smokeless tobacco: Never   Vaping Use    Vaping status: Never Used   Substance and Sexual Activity    Alcohol use: Yes     Alcohol/week: 1.0 standard drink of alcohol     Types: 1 Standard drinks or equivalent per week     Comment: socially twice a month    Drug use: Yes     Frequency: 7.0 times per week     Types: Marijuana    Sexual activity: Yes     Partners: Male     Social Drivers of Health     Financial Resource Strain: High Risk (01/30/2024)    Overall Financial Resource Strain (CARDIA)     Difficulty of Paying Living Expenses: Hard   Food Insecurity: Food Insecurity Present (01/30/2024)    Hunger Vital Sign     Worried About Running Out of Food in the Last Year: Sometimes true     Ran Out of Food in the Last Year: Sometimes true   Transportation Needs: No Transportation Needs (01/30/2024)    PRAPARE - Therapist, Art (Medical): No     Lack of Transportation (Non-Medical): No   Recent Concern:  Transportation Needs - Unmet Transportation Needs (11/02/2023)    PRAPARE - Transportation     Lack of Transportation (Medical): Yes     Lack of Transportation (Non-Medical): Yes   Physical Activity: Sufficiently Active (01/30/2024)    Exercise Vital Sign  Days of Exercise per Week: 5 days     Minutes of Exercise per Session: 30 min   Stress: Stress Concern Present (01/30/2024)    [employer] of Occupational [employer] - Occupational Stress Questionnaire     Feeling of Stress : Very much   Intimate Partner Violence: Not At Risk (01/30/2024)    Humiliation, Afraid, Rape, and Kick questionnaire     Fear of Current or Ex-Partner: No     Emotionally Abused: No     Physically Abused: No     Sexually Abused: No   Housing Stability: Not At Risk (01/30/2024)    Housing Stability NCSS     Do you have housing?: Yes     Are you worried about losing your housing?: No   [4]   Family History  Problem Relation Name Age of Onset    Thyroid disease Father      Diverticulitis Father      Heart disease Maternal Uncle          Pace marker    Diabetes Maternal Grandmother      Heart disease Maternal Grandmother      Heart disease Maternal Grandfather      Diabetes Paternal Grandmother      Thyroid disease Paternal Grandmother      Diabetes Paternal Grandfather     [5]   Allergies  Allergen Reactions    Cat Dander     Other      Tumeric and polllon   [6]   Current Outpatient Medications   Medication Sig Dispense Refill    famotidine  (PEPCID ) 10 MG tablet Take 1 tablet (10 mg) by mouth 2 (two) times daily      fexofenadine (ALLEGRA ODT) 30 MG disintegrating tablet Dissolve 1 tablet (30 mg) in the mouth 2 (two) times daily      hydrocortisone  (ANUSOL -HC) 25 MG suppository Place 1 suppository (25 mg) rectally once daily 30 suppository 11    mesalamine (APRISO) 0.375 g 24 hr capsule Take 4 capsules (1.5 g) by mouth once daily 120 capsule 3    omeprazole (PriLOSEC) 40 MG capsule Take 1 capsule (40 mg) by mouth once daily 30 capsule 11     TiZANidine (ZANAFLEX) 2 MG capsule Take 1 capsule (2 mg) by mouth 2 (two) times daily She will take 6mg  at night and 2mg  during the day 60 capsule 0    TiZANidine (ZANAFLEX) 6 MG capsule Take 1 capsule (6 mg) by mouth 3 (three) times daily 30 capsule 2     No current facility-administered medications for this visit.

## 2024-02-17 LAB — LAB USE ONLY - GOLD SST HOLD TUBE

## 2024-02-17 LAB — CHLAMYDIA TRACHOMATIS, NEISSERIA GONORRHEA AND TRICHOMONAS VAGINALIS, PCR
Chlamydia trachomatis DNA: NEGATIVE
Neisseria gonorrhoeae DNA: NEGATIVE
Trichomonas vaginalis DNA: NEGATIVE

## 2024-02-17 LAB — LAB USE ONLY - LAVENDER - EDTA HOLD TUBE

## 2024-02-17 MED ORDER — PEG 3350-KCL-NABCB-NACL-NASULF 236 G PO SOLR
1.0000 | Freq: Once | ORAL | 0 refills | Status: AC
Start: 2024-02-17 — End: 2024-02-17

## 2024-02-17 MED ORDER — HYDROCORTISONE 2.5 % EX CREA
TOPICAL_CREAM | Freq: Two times a day (BID) | CUTANEOUS | 0 refills | Status: AC
Start: 2024-02-17 — End: ?

## 2024-02-17 NOTE — Addendum Note (Signed)
 Addended by: MARLANE SHARLET RAMAN on: 02/17/2024 06:35 PM     Modules accepted: Orders

## 2024-02-17 NOTE — Progress Notes (Signed)
 Subjective:      Patient ID: Cheryl Mcneil is a 28 y.o. female     Chief Complaint   Patient presents with    OMT     Non fasting      HPI   Patient presents with low back pain for two years. Has left sided sciatica. Using the tizanidine  which helps at night, not waking up from pain, but too tired during the day so would like a lower dose during that time. Getting through onboarding on work. Has had some improvement with omt.     The following portions of the patient's history were reviewed and updated as appropriate: allergies, current medications, past family history, past medical history, past social history, past surgical history, and problem list.    Current Medications:  Medications Taking[1]    Allergies:  Allergies[2]    ROS         BP 127/82 (BP Site: Right arm, Patient Position: Sitting, Cuff Size: Large)   Pulse (!) 114   Temp 97.4 F (36.3 C)   Ht 1.69 m (5' 6.54)   Wt 114.6 kg (252 lb 9.6 oz)   LMP 01/22/2024 (Approximate)   SpO2 97%   BMI 40.12 kg/m    Objective:   Physical Exam   - Right anterior innominate, L on L forward sacral torsion, bilateral tmj tightness      Assessment and Plan:   1. Left-sided low back pain with left-sided sciatica, unspecified chronicity  - TiZANidine  (ZANAFLEX ) 2 MG capsule; Take 1 capsule (2 mg) by mouth 2 (two) times daily She will take 6mg  at night and 2mg  during the day  Dispense: 60 capsule; Refill: 0  - TiZANidine  (ZANAFLEX ) 6 MG capsule; Take 1 capsule (6 mg) by mouth 3 (three) times daily  Dispense: 30 capsule; Refill: 2  - ketorolac  (TORADOL ) injection 30 mg    2. PMDD (premenstrual dysphoric disorder)  - Estradiol ; Future  - Luteinizing Hormone; Future  - Follicle Stimulating Hormone; Future  - Thyroid  Stimulating Hormone (TSH) with Reflex to Free T4; Future  - Dehydroepiandrosterone (DHEA) Sulfate; Future  - Estradiol   - Luteinizing Hormone  - Follicle Stimulating Hormone  - Thyroid  Stimulating Hormone (TSH) with Reflex to Free T4  -  Dehydroepiandrosterone (DHEA) Sulfate    3. Somatic dysfunction of pelvis region    4. Somatic dysfunction of spine, sacral    5. Somatic dysfunction of head region     - Consent was obtained, patient was placed in the supine position and innominate treated with muscle energy, the lateral recumbent position and sacrum treated with muscle energy, and the seated position and tmj treated with soft tissue and fpr. Recommended application of moist heat on the area of pain multiple times a day for the next few days. A heating pad may be used. Also recommend warm showers with water pounding on the area of pain. Always make sure to check temperature of water or object before applying it to avoid burns.     Risk & Benefits of any new medication(s) were explained to the patient who verbalized understanding & agreed to the treatment plan.     [name], DO               [1]   Outpatient Medications Marked as Taking for the 01/31/24 encounter (Office Visit) with Sharps Butler JINNY, DO   Medication Sig Dispense Refill    famotidine  (PEPCID ) 10 MG tablet Take 1 tablet (10 mg) by mouth  2 (two) times daily      fexofenadine (ALLEGRA ODT) 30 MG disintegrating tablet Dissolve 1 tablet (30 mg) in the mouth 2 (two) times daily      [EXPIRED] hydrocortisone  (ANUSOL -HC) 2.5 % rectal cream Place rectally 2 (two) times daily (Patient taking differently: Place rectally 2 (two) times daily PRN) 30 g 0    [DISCONTINUED] TiZANidine  (ZANAFLEX ) 6 MG capsule Take 1 capsule (6 mg) by mouth 3 (three) times daily 30 capsule 2   [2]   Allergies  Allergen Reactions    Cat Dander     Other      Tumeric and polllon

## 2024-02-18 ENCOUNTER — Other Ambulatory Visit (INDEPENDENT_AMBULATORY_CARE_PROVIDER_SITE_OTHER): Payer: Self-pay | Admitting: Family Medicine

## 2024-02-18 DIAGNOSIS — M5442 Lumbago with sciatica, left side: Secondary | ICD-10-CM

## 2024-02-21 ENCOUNTER — Ambulatory Visit (INDEPENDENT_AMBULATORY_CARE_PROVIDER_SITE_OTHER): Payer: Self-pay | Admitting: Family Nurse Practitioner

## 2024-02-21 LAB — LAB USE ONLY- GYN CYTOLOGY/PAP SMEAR

## 2024-02-21 MED ORDER — TIZANIDINE HCL 2 MG PO CAPS: 2.0000 mg | ORAL_CAPSULE | Freq: Three times a day (TID) | ORAL | 0 refills | Status: DC | PRN

## 2024-02-21 NOTE — Telephone Encounter (Signed)
 Last office visit: 02/09/24  Last refill on: 01/31/24  Upcoming appointment: 03/13/2024    Sig - Route: Take 1 capsule (2 mg) by mouth 2 (two) times daily She will take 6mg  at night and 2mg  during the day

## 2024-02-27 ENCOUNTER — Ambulatory Visit (INDEPENDENT_AMBULATORY_CARE_PROVIDER_SITE_OTHER): Payer: Self-pay | Admitting: Nurse Practitioner

## 2024-02-29 ENCOUNTER — Ambulatory Visit (INDEPENDENT_AMBULATORY_CARE_PROVIDER_SITE_OTHER)

## 2024-03-02 ENCOUNTER — Encounter (INDEPENDENT_AMBULATORY_CARE_PROVIDER_SITE_OTHER): Payer: Self-pay | Admitting: Family Medicine

## 2024-03-02 ENCOUNTER — Ambulatory Visit (INDEPENDENT_AMBULATORY_CARE_PROVIDER_SITE_OTHER)

## 2024-03-03 ENCOUNTER — Telehealth (INDEPENDENT_AMBULATORY_CARE_PROVIDER_SITE_OTHER): Admitting: Family Medicine

## 2024-03-03 ENCOUNTER — Encounter (INDEPENDENT_AMBULATORY_CARE_PROVIDER_SITE_OTHER): Payer: Self-pay | Admitting: Family Medicine

## 2024-03-03 DIAGNOSIS — Z30012 Encounter for prescription of emergency contraception: Secondary | ICD-10-CM

## 2024-03-03 MED ORDER — ULIPRISTAL ACETATE 30 MG PO TABS: 1.0000 | ORAL_TABLET | Freq: Once | ORAL | 0 refills | Status: AC

## 2024-03-03 NOTE — Progress Notes (Signed)
  PRIMARY [facility] - [facility]       Telehealth:  The Patient has given verbal consent for delivery of health care via telehealth.   The patient is located at Home in [place]   The encounter provider is located at their Medical Office in [place]   Epic Video Client was utilized for Real Time/Synchronous Telehealth.   The time spent in medical discussion during this visit was 5 minutes.        Subjective     Chief Complaint   Patient presents with    Emergency Contraception     History of Present Illness  Cheryl Mcneil is a 28 year old female who presents with difficulty in managing birth control options.    She reports decreased sexual desire on oral contraceptive pills and does not want to use them. She has tried IUDs, which she says her body rejects, and does not want to retry them. Nexplanon caused depression and weight gain, and she is worried about device migration, so it has been removed and she declines it now.    She sometimes uses spermicide but develops vaginal irritation with [place] products. She owns a femcap but cannot currently afford the non-irritating spermicide that must be ordered internationally, though she expects this may change now that she has a new job.    She has not tried the contraceptive patch and is concerned she would not use it consistently. She is currently using condoms as her main birth control method.  Review of Systems    Objective   There were no vitals taken for this visit.  Physical Exam  Nursing note reviewed.   Constitutional:       Appearance: Normal appearance.   HENT:      Mouth/Throat:      Mouth: Mucous membranes are moist.   Eyes:      Extraocular Movements: Extraocular movements intact.   Pulmonary:      Effort: Pulmonary effort is normal.   Neurological:      Mental Status: She is alert.   Psychiatric:         Mood and Affect: Mood normal.       Physical Exam       Results        Assessment/Plan     Assessment & Plan  Emergency contraception prescribed  F/u  with GYN to discuss nexplanon for LARC  Orders:    Ulipristal Acetate  30 MG Tablet; Take 1 tablet (30 mg) by mouth once for 1 dose      Verbal consent obtained to record this visit.

## 2024-03-05 ENCOUNTER — Other Ambulatory Visit (INDEPENDENT_AMBULATORY_CARE_PROVIDER_SITE_OTHER): Payer: Self-pay | Admitting: Family Medicine

## 2024-03-05 DIAGNOSIS — M5442 Lumbago with sciatica, left side: Secondary | ICD-10-CM

## 2024-03-07 NOTE — Telephone Encounter (Signed)
 Last office visit: 01/31/24  Last refill on: 01/31/24  Upcoming appointment: 03/13/2024    Left-sided low back pain with left-sided sciatica, unspecified chronicity  - TiZANidine  (ZANAFLEX ) 2 MG capsule; Take 1 capsule (2 mg) by mouth 2 (two) times daily She will take 6mg  at night and 2mg  during the day  Dispense: 60 capsule; Refill: 0  - TiZANidine  (ZANAFLEX ) 6 MG capsule; Take 1 capsule (6 mg) by mouth 3 (three) times daily  Dispense: 30 capsule; Refill: 2  - ketorolac  (TORADOL ) injection 30 mg

## 2024-03-13 ENCOUNTER — Encounter (INDEPENDENT_AMBULATORY_CARE_PROVIDER_SITE_OTHER): Payer: Self-pay | Admitting: Family Medicine

## 2024-03-13 ENCOUNTER — Ambulatory Visit (INDEPENDENT_AMBULATORY_CARE_PROVIDER_SITE_OTHER): Admitting: Family Medicine

## 2024-03-13 VITALS — BP 124/74 | HR 85 | Temp 98.4°F | Wt 250.0 lb

## 2024-03-13 DIAGNOSIS — M9905 Segmental and somatic dysfunction of pelvic region: Secondary | ICD-10-CM

## 2024-03-13 DIAGNOSIS — M9904 Segmental and somatic dysfunction of sacral region: Secondary | ICD-10-CM

## 2024-03-13 DIAGNOSIS — M9903 Segmental and somatic dysfunction of lumbar region: Secondary | ICD-10-CM

## 2024-03-13 DIAGNOSIS — M5442 Lumbago with sciatica, left side: Secondary | ICD-10-CM

## 2024-03-13 DIAGNOSIS — R21 Rash and other nonspecific skin eruption: Secondary | ICD-10-CM

## 2024-03-13 DIAGNOSIS — M9900 Segmental and somatic dysfunction of head region: Secondary | ICD-10-CM

## 2024-03-13 MED ORDER — KETOROLAC TROMETHAMINE 30 MG/ML IJ SOLN
30.0000 mg | Freq: Once | INTRAMUSCULAR | Status: AC
  Administered 2024-03-13: 30 mg via INTRAMUSCULAR

## 2024-03-13 MED ORDER — TRIAMCINOLONE ACETONIDE 0.1 % EX OINT: TOPICAL_OINTMENT | Freq: Three times a day (TID) | CUTANEOUS | 1 refills | Status: AC

## 2024-03-13 NOTE — Progress Notes (Signed)
 ketorolac  (TORADOL ) injection 30 mg vb Cheryl Mcneil

## 2024-03-13 NOTE — Progress Notes (Signed)
Have you seen any specialists/other providers since your last visit with us?    Yes. GI.       The patient was informed that the following HM items are still outstanding:   There are no preventive care reminders to display for this patient.

## 2024-03-13 NOTE — Progress Notes (Signed)
 Subjective:      Patient ID: Cheryl Mcneil is a 28 y.o. female     Chief Complaint   Patient presents with    OMT Treatment      HPI   Patient presents with low back pain for two years. Pain is worse since she has not had treatment in one month, but she is recovering faster and pain is not as severe when she is doing physical activity. Jaw is hurting.     The following portions of the patient's history were reviewed and updated as appropriate: allergies, current medications, past family history, past medical history, past social history, past surgical history, and problem list.    Current Medications:  Medications Taking[1]    Allergies:  Allergies[2]    ROS         BP 124/74 (BP Site: Left arm, Patient Position: Sitting, Cuff Size: Medium)   Pulse 85   Temp 98.4 F (36.9 C) (Temporal)   Wt 113.4 kg (250 lb)   SpO2 98%   BMI 41.60 kg/m    Objective:   Physical Exam   - Right anterior innominate, R on R forward sacral torsion, bilateral tmj tightness, L3-5ERLSL      Assessment and Plan:   1. Exanthem  - triamcinolone  (KENALOG ) 0.1 % ointment; Apply topically 3 (three) times daily for 7 days  Dispense: 30 g; Refill: 1    2. Left-sided low back pain with left-sided sciatica, unspecified chronicity  - ketorolac  (TORADOL ) injection 30 mg     - Consent was obtained, patient was placed in the supine position and innominate treated with muscle energy, the lateral recumbent position and sacrum treated with muscle energy, the supine position and lumbar treated with soft tissue and fpr and the seated position and tmj treated with soft tissue and fpr. Recommended application of moist heat on the area of pain multiple times a day for the next few days. A heating pad may be used. Also recommend warm showers with water pounding on the area of pain. Always make sure to check temperature of water or object before applying it to avoid burns.     Risk & Benefits of any new medication(s) were explained to the patient who  verbalized understanding & agreed to the treatment plan.     [name], DO                 [1]   Outpatient Medications Marked as Taking for the 03/13/24 encounter (Office Visit) with Sharps Butler JINNY, DO   Medication Sig Dispense Refill    famotidine  (PEPCID ) 10 MG tablet Take 1 tablet (10 mg) by mouth 2 (two) times daily (Patient taking differently: Take 1 tablet (10 mg) by mouth as needed)      fexofenadine (ALLEGRA ODT) 30 MG disintegrating tablet Dissolve 1 tablet (30 mg) in the mouth 2 (two) times daily      hydrocortisone  2.5 % cream Apply topically 2 (two) times daily 30 g 0    mesalamine  (APRISO ) 0.375 g 24 hr capsule Take 4 capsules (1.5 g) by mouth once daily 120 capsule 3    omeprazole  (PriLOSEC) 40 MG capsule Take 1 capsule (40 mg) by mouth once daily 30 capsule 11    TiZANidine  (ZANAFLEX ) 2 MG capsule Take 1 capsule (2 mg) by mouth 3 (three) times daily as needed for Muscle spasms She will take 6mg  at night and 2mg  during the day 90 capsule 0    TiZANidine  (ZANAFLEX )  6 MG capsule Take 1 capsule (6 mg) by mouth 3 (three) times daily (Patient taking differently: Take 1 capsule (6 mg) by mouth once at bedtime) 30 capsule 2     Current Facility-Administered Medications for the 03/13/24 encounter (Office Visit) with Claudene Butler PARAS, DO   Medication Dose Route Frequency Provider Last Rate Last Admin    ketorolac  (TORADOL ) injection 30 mg  30 mg Intramuscular Once Jericha Bryden J, DO       [2]   Allergies  Allergen Reactions    Cat Dander     Other      Tumeric and polllon

## 2024-03-21 ENCOUNTER — Encounter (INDEPENDENT_AMBULATORY_CARE_PROVIDER_SITE_OTHER): Payer: Self-pay | Admitting: Family Medicine

## 2024-03-21 ENCOUNTER — Ambulatory Visit (INDEPENDENT_AMBULATORY_CARE_PROVIDER_SITE_OTHER): Admitting: Family Medicine

## 2024-03-21 VITALS — BP 115/73 | HR 85 | Temp 98.4°F

## 2024-03-21 DIAGNOSIS — M9901 Segmental and somatic dysfunction of cervical region: Secondary | ICD-10-CM

## 2024-03-21 DIAGNOSIS — M9904 Segmental and somatic dysfunction of sacral region: Secondary | ICD-10-CM

## 2024-03-21 DIAGNOSIS — M9900 Segmental and somatic dysfunction of head region: Secondary | ICD-10-CM

## 2024-03-21 DIAGNOSIS — M9905 Segmental and somatic dysfunction of pelvic region: Secondary | ICD-10-CM

## 2024-03-21 DIAGNOSIS — M9903 Segmental and somatic dysfunction of lumbar region: Secondary | ICD-10-CM

## 2024-03-21 DIAGNOSIS — M5442 Lumbago with sciatica, left side: Secondary | ICD-10-CM

## 2024-03-21 MED ORDER — KETOROLAC TROMETHAMINE 60 MG/2ML IM SOLN
60.0000 mg | Freq: Once | INTRAMUSCULAR | Status: AC
  Administered 2024-03-21: 60 mg via INTRAMUSCULAR

## 2024-03-21 NOTE — Progress Notes (Signed)
 Subjective:      Patient ID: Cheryl Mcneil is a 28 y.o. female     Chief Complaint   Patient presents with    OMT Treatment      HPI   Patient presents with low back pain for two years. Pain is improving with treatment but having some pain in the hips and si joint as she was seated in a car for six hours. She is not taking nsaids due to crohns and is awaiting a colonoscopy. Mesalamine  is making the crohns symptoms worse.     The following portions of the patient's history were reviewed and updated as appropriate: allergies, current medications, past family history, past medical history, past social history, past surgical history, and problem list.    Current Medications:  Medications Taking[1]    Allergies:  Allergies[2]    ROS         BP 115/73 (BP Site: Left arm, Patient Position: Sitting, Cuff Size: Medium)   Pulse 85   Temp 98.4 F (36.9 C) (Temporal)   SpO2 98%    Objective:   Physical Exam   - Right anterior innominate, L on L forward sacral torsion, bilateral tmj tightness, L3-5ERLSL, C2-C5FRLSL      Assessment and Plan:   1. Left-sided low back pain with left-sided sciatica, unspecified chronicity  - ketorolac  (TORADOL ) IM injection 60 mg    2. Somatic dysfunction of pelvis region    3. Somatic dysfunction of spine, sacral    4. Somatic dysfunction of head region    5. Somatic dysfunction of spine, lumbar    6. Somatic dysfunction of spine, cervical     - Consent was obtained, patient was placed in the supine position and innominate treated with muscle energy and cervical with muscle energy, the lateral recumbent position and sacrum treated with muscle energy, the prone position and lumbar treated with soft tissue and fpr and the seated position and tmj treated with soft tissue and fpr. Recommended application of moist heat on the area of pain multiple times a day for the next few days. A heating pad may be used. Also recommend warm showers with water pounding on the area of pain. Always make sure  to check temperature of water or object before applying it to avoid burns.     Risk & Benefits of any new medication(s) were explained to the patient who verbalized understanding & agreed to the treatment plan.     [name], DO                   [1]   No outpatient medications have been marked as taking for the 03/21/24 encounter (Office Visit) with Sharps Butler JINNY, DO.     Current Facility-Administered Medications for the 03/21/24 encounter (Office Visit) with Sharps Butler JINNY, DO   Medication Dose Route Frequency Provider Last Rate Last Admin    [COMPLETED] ketorolac  (TORADOL ) IM injection 60 mg  60 mg Intramuscular Once Astraea Gaughran J, DO   60 mg at 03/21/24 1153   [2]   Allergies  Allergen Reactions    Cat Dander     Other      Tumeric and polllon

## 2024-03-21 NOTE — Progress Notes (Signed)
 ketorolac  (TORADOL ) IM injection 60 mg vb: Alfonso Bale, MA

## 2024-03-26 ENCOUNTER — Encounter (INDEPENDENT_AMBULATORY_CARE_PROVIDER_SITE_OTHER): Payer: Self-pay | Admitting: Family Medicine

## 2024-03-28 ENCOUNTER — Ambulatory Visit (INDEPENDENT_AMBULATORY_CARE_PROVIDER_SITE_OTHER): Admitting: Family Medicine

## 2024-03-28 ENCOUNTER — Encounter (INDEPENDENT_AMBULATORY_CARE_PROVIDER_SITE_OTHER): Payer: Self-pay | Admitting: Family Medicine

## 2024-03-28 VITALS — BP 132/84 | HR 93 | Temp 97.8°F | Wt 250.0 lb

## 2024-03-28 DIAGNOSIS — N62 Hypertrophy of breast: Secondary | ICD-10-CM

## 2024-03-28 DIAGNOSIS — M9902 Segmental and somatic dysfunction of thoracic region: Secondary | ICD-10-CM

## 2024-03-28 DIAGNOSIS — M9905 Segmental and somatic dysfunction of pelvic region: Secondary | ICD-10-CM

## 2024-03-28 DIAGNOSIS — M5442 Lumbago with sciatica, left side: Secondary | ICD-10-CM

## 2024-03-28 DIAGNOSIS — M9904 Segmental and somatic dysfunction of sacral region: Secondary | ICD-10-CM

## 2024-03-28 DIAGNOSIS — M9903 Segmental and somatic dysfunction of lumbar region: Secondary | ICD-10-CM

## 2024-03-28 DIAGNOSIS — M542 Cervicalgia: Secondary | ICD-10-CM

## 2024-03-28 DIAGNOSIS — M9900 Segmental and somatic dysfunction of head region: Secondary | ICD-10-CM

## 2024-03-28 MED ORDER — KETOROLAC TROMETHAMINE 60 MG/2ML IM SOLN
60.0000 mg | Freq: Once | INTRAMUSCULAR | Status: AC
  Administered 2024-03-28: 60 mg via INTRAMUSCULAR

## 2024-03-28 MED ORDER — TIZANIDINE HCL 4 MG PO CAPS: 4.0000 mg | ORAL_CAPSULE | Freq: Three times a day (TID) | ORAL | 1 refills | Status: DC

## 2024-03-28 NOTE — Progress Notes (Unsigned)
 Subjective:      Patient ID: Cheryl Mcneil is a 28 y.o. female     Chief Complaint   Patient presents with    OMT Treatment      HPI   Patient presents with low back pain for two years. Pain is improving with treatment but she did have a fall off the longboard so some si joint pain is worsened today.     The following portions of the patient's history were reviewed and updated as appropriate: allergies, current medications, past family history, past medical history, past social history, past surgical history, and problem list.    Current Medications:  Medications Taking[1]    Allergies:  Allergies[2]    ROS         BP 132/84 (BP Site: Left arm, Patient Position: Sitting, Cuff Size: Medium)   Pulse 93   Temp 97.8 F (36.6 C) (Temporal)   Wt 113.4 kg (250 lb)   SpO2 98%   BMI 41.60 kg/m    Objective:   Physical Exam   - Left anterior innominate, L on L forward sacral torsion, bilateral tmj tightness, T4-T7FRLSL, L3-5ERLSL, dry needling done to bilateral erector spine muscles.       Assessment and Plan:   1. Left-sided low back pain with left-sided sciatica, unspecified chronicity  - TiZANidine  (ZANAFLEX ) 4 MG capsule; Take 1 capsule (4 mg) by mouth 3 (three) times daily  Dispense: 90 capsule; Refill: 1  - ketorolac  (TORADOL ) IM injection 60 mg    2. Somatic dysfunction of pelvis region    3. Large breasts  - Referral to General Surgery ([facility]); Future    4. Neck pain  - Referral to Neurology (); Future     - Consent was obtained, patient was placed in the supine position and innominate treated with muscle energy, the lateral recumbent position and sacrum treated with muscle energy, the prone position and lumbar treated with soft tissue and fpr and thoracic treated with soft tissue and hvla, and the seated position and tmj treated with soft tissue and fpr. Recommended application of moist heat on the area of pain multiple times a day for the next few days. A heating pad may be used. Also recommend  warm showers with water pounding on the area of pain. Always make sure to check temperature of water or object before applying it to avoid burns.     Risk & Benefits of any new medication(s) were explained to the patient who verbalized understanding & agreed to the treatment plan.     [name], DO                     [1]   No outpatient medications have been marked as taking for the 03/28/24 encounter (Office Visit) with Sharps Butler JINNY, DO.     Current Facility-Administered Medications for the 03/28/24 encounter (Office Visit) with Sharps Butler JINNY, DO   Medication Dose Route Frequency Provider Last Rate Last Admin    ketorolac  (TORADOL ) IM injection 60 mg  60 mg Intramuscular Once Sharps Butler JINNY, DO       [2]   Allergies  Allergen Reactions    Cat Dander     Other      Tumeric and polllon

## 2024-03-28 NOTE — Progress Notes (Unsigned)
ketorolac (TORADOL) IM injection 60 mg verified Mal Amabile MA

## 2024-03-28 NOTE — Progress Notes (Unsigned)
 Have you seen any specialists/other providers since your last visit with [place]?    No      The patient was informed that the following HM items are still outstanding:   There are no preventive care reminders to display for this patient.

## 2024-03-31 ENCOUNTER — Telehealth (INDEPENDENT_AMBULATORY_CARE_PROVIDER_SITE_OTHER): Payer: Self-pay

## 2024-03-31 NOTE — Telephone Encounter (Signed)
All records in Epic.

## 2024-03-31 NOTE — Telephone Encounter (Signed)
 Patient:  Cheryl Mcneil   Patient Phone Number:  Preferred: [phone] / Other: [phone] (home) [phone] (work)    Appointment Date: 04/06/2024   Cardiologist: Ozell KANDICE Lien, MD    Location: [facility]    Diagnosis:  Hypermobility syndrome    Referring MD:   Claudene Butler JINNY, DO    Referring MD Phone Number:

## 2024-04-03 ENCOUNTER — Ambulatory Visit (INDEPENDENT_AMBULATORY_CARE_PROVIDER_SITE_OTHER): Admitting: Family Nurse Practitioner

## 2024-04-04 ENCOUNTER — Encounter (INDEPENDENT_AMBULATORY_CARE_PROVIDER_SITE_OTHER): Payer: Self-pay | Admitting: Family Medicine

## 2024-04-04 ENCOUNTER — Ambulatory Visit (INDEPENDENT_AMBULATORY_CARE_PROVIDER_SITE_OTHER): Admitting: Family Medicine

## 2024-04-04 VITALS — BP 132/84 | HR 88 | Temp 98.1°F | Wt 250.0 lb

## 2024-04-04 DIAGNOSIS — M5442 Lumbago with sciatica, left side: Secondary | ICD-10-CM

## 2024-04-04 DIAGNOSIS — M9904 Segmental and somatic dysfunction of sacral region: Secondary | ICD-10-CM

## 2024-04-04 DIAGNOSIS — M9902 Segmental and somatic dysfunction of thoracic region: Secondary | ICD-10-CM

## 2024-04-04 DIAGNOSIS — M9900 Segmental and somatic dysfunction of head region: Secondary | ICD-10-CM

## 2024-04-04 DIAGNOSIS — M9903 Segmental and somatic dysfunction of lumbar region: Secondary | ICD-10-CM

## 2024-04-04 DIAGNOSIS — M9905 Segmental and somatic dysfunction of pelvic region: Secondary | ICD-10-CM

## 2024-04-04 MED ORDER — KETOROLAC TROMETHAMINE 60 MG/2ML IM SOLN
60.0000 mg | Freq: Once | INTRAMUSCULAR | Status: AC
  Administered 2024-04-04: 60 mg via INTRAMUSCULAR

## 2024-04-04 NOTE — Progress Notes (Signed)
 Have you seen any specialists/other providers since your last visit with [place]?    No      The patient was informed that the following HM items are still outstanding:   There are no preventive care reminders to display for this patient.

## 2024-04-04 NOTE — Progress Notes (Signed)
 Subjective:      Patient ID: Cheryl Mcneil is a 29 y.o. female     Chief Complaint   Patient presents with    OMT Treatment      HPI   Patient presents with low back pain for two years. Pain is improving with treatment and jaw has improved as well. Using a massager and tems unit on the masseter at home which is helping.     The following portions of the patient's history were reviewed and updated as appropriate: allergies, current medications, past family history, past medical history, past social history, past surgical history, and problem list.    Current Medications:  Medications Taking[1]    Allergies:  Allergies[2]    ROS         BP 132/84 (BP Site: Left arm, Patient Position: Sitting, Cuff Size: Medium)   Pulse 88   Temp 98.1 F (36.7 C) (Temporal)   Wt 113.4 kg (250 lb)   SpO2 97%   BMI 41.60 kg/m    Objective:   Physical Exam   - Left anterior innominate, L on R backward sacral torsion, bilateral tmj tightness, T4-T7FRLSL, L3-5FRLSL, dry needling done to bilateral erector spine muscles.       Assessment and Plan:   1. Left-sided low back pain with left-sided sciatica, unspecified chronicity  - ketorolac  (TORADOL ) IM injection 60 mg     - Consent was obtained, patient was placed in the supine position and innominate treated with muscle energy, the lateral recumbent position and sacrum treated with muscle energy, the prone position and lumbar treated with soft tissue and fpr and thoracic treated with soft tissue and hvla, and the seated position and tmj treated with soft tissue and fpr. Recommended application of moist heat on the area of pain multiple times a day for the next few days. A heating pad may be used. Also recommend warm showers with water pounding on the area of pain. Always make sure to check temperature of water or object before applying it to avoid burns.     Risk & Benefits of any new medication(s) were explained to the patient who verbalized understanding & agreed to the  treatment plan.     [name], DO                       [1]   Outpatient Medications Marked as Taking for the 04/04/24 encounter (Office Visit) with Sharps Butler JINNY, DO   Medication Sig Dispense Refill    famotidine  (PEPCID ) 10 MG tablet Take 1 tablet (10 mg) by mouth 2 (two) times daily (Patient taking differently: Take 1 tablet (10 mg) by mouth as needed)      fexofenadine (ALLEGRA ODT) 30 MG disintegrating tablet Dissolve 1 tablet (30 mg) in the mouth 2 (two) times daily      hydrocortisone  2.5 % cream Apply topically 2 (two) times daily 30 g 0    mesalamine  (APRISO ) 0.375 g 24 hr capsule Take 4 capsules (1.5 g) by mouth once daily 120 capsule 3    omeprazole  (PriLOSEC) 40 MG capsule Take 1 capsule (40 mg) by mouth once daily 30 capsule 11    TiZANidine  (ZANAFLEX ) 4 MG capsule Take 1 capsule (4 mg) by mouth 3 (three) times daily 90 capsule 1    TiZANidine  (ZANAFLEX ) 6 MG capsule Take 1 capsule (6 mg) by mouth 3 (three) times daily (Patient taking differently: Take 1 capsule (6 mg) by mouth once  at bedtime) 30 capsule 2     Current Facility-Administered Medications for the 04/04/24 encounter (Office Visit) with Claudene Butler PARAS, DO   Medication Dose Route Frequency Provider Last Rate Last Admin    [COMPLETED] ketorolac  (TORADOL ) IM injection 60 mg  60 mg Intramuscular Once Keivon Garden J, DO   60 mg at 04/04/24 1018   [2]   Allergies  Allergen Reactions    Cat Dander     Other      Tumeric and polllon

## 2024-04-04 NOTE — Progress Notes (Signed)
 ketorolac  (TORADOL ) IM injection 60 mg vb Baine Decesare gonzalez sosa

## 2024-04-06 ENCOUNTER — Encounter (INDEPENDENT_AMBULATORY_CARE_PROVIDER_SITE_OTHER): Payer: Self-pay

## 2024-04-06 ENCOUNTER — Ambulatory Visit (INDEPENDENT_AMBULATORY_CARE_PROVIDER_SITE_OTHER)

## 2024-04-06 VITALS — BP 127/86 | HR 98 | Ht 65.0 in | Wt 250.0 lb

## 2024-04-06 DIAGNOSIS — Z136 Encounter for screening for cardiovascular disorders: Secondary | ICD-10-CM

## 2024-04-06 DIAGNOSIS — R011 Cardiac murmur, unspecified: Secondary | ICD-10-CM

## 2024-04-06 DIAGNOSIS — M357 Hypermobility syndrome: Secondary | ICD-10-CM

## 2024-04-06 DIAGNOSIS — Z8249 Family history of ischemic heart disease and other diseases of the circulatory system: Secondary | ICD-10-CM

## 2024-04-06 LAB — ECG 12-LEAD
Atrial Rate: 93 {beats}/min
IHS MUSE NARRATIVE AND IMPRESSION: NORMAL
P Axis: 67 degrees
P-R Interval: 142 ms
Q-T Interval: 366 ms
QRS Duration: 84 ms
QTC Calculation (Bezet): 455 ms
R Axis: 68 degrees
T Axis: 56 degrees
Ventricular Rate: 93 {beats}/min

## 2024-04-06 NOTE — Progress Notes (Signed)
 Cardiology Progress Note    Patient Name: Cheryl Mcneil  DOB:  Nov 16, 1995    Age:  29 y.o.   MRN: [id number]   PCP: Claudene Butler JINNY, DO   Date of service: April 06, 2024         Problem List:     Problem List[1]      HPI:     Patient is a 29 y.o. female with history of ulcerative colitis, being evaluated by primary care for hypermobility syndrome, presenting today for cardiac evaluation.  Patient states that she has been feeling relatively well currently.  She states that several years ago she used to weight about 350 lbs and through diet and exercise, she has lost about 100 lbs.  She states that she has been physically active, doing long board skating 1-2 hours a few times a week.  She denies issues with chest pain, chest pressure, shortness of breath, N/V, diaphoresis, palpitations, orthopnea, PND, or LE swelling.      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  She states she recalls having two TEEs done to check for cardiac infection but fortunately both were negative.  She denies any subsequent cardiac issues related to that event since.       Past Medical History:     Medical History[2]         Surgical History:     Past Surgical History[3]       Family History:     Family History[4]         Social History:     Social History[5]       Home Medications:     Current outpatient medications:  Current Outpatient Medications   Medication Instructions    famotidine  (PEPCID ) 10 mg, 2 times daily    fexofenadine (ALLEGRA ODT) 30 mg, 2 times daily    hydrocortisone  2.5 % cream Topical, 2 times daily    mesalamine  (APRISO ) 1.5 g, Oral, Daily    omeprazole  (PRILOSEC) 40 mg, Oral, Daily    TiZANidine  (ZANAFLEX ) 6 mg, Oral, 3 times daily    TiZANidine  (ZANAFLEX ) 4 mg, Oral, 3 times daily              Allergies     Allergies[6]         Objective:     BP 127/86 (BP Site: Right arm,  Patient Position: Sitting)   Pulse 98   Ht 1.651 m (5' 5)   Wt 113.4 kg (250 lb)   BMI 41.60 kg/m      Constitutional:  Alert, obese, orient x 3, not anxious, no acute distress  Neck:  Supple, no JVD, no carotid bruits.  Lungs:  Clear to auscultation.  No wheezes or crackles.  Cardio:  Normal rate and regular rhythm.  1/6 murmur.  No gallops or rubs.  Extremities:  No edema.        Labs:     CMP:  Lab Results   Component Value Date    NA 142 11/03/2023    K 4.5 11/03/2023    MG 2.1 09/09/2011    BUN 14 11/03/2023    CREAT 0.8 11/03/2023    GLU 92 11/03/2023    AST 23 11/03/2023    ALT 31 11/03/2023    CBC:  Lab Results   Component Value Date    WBC 11.23 (  H) 11/03/2023    HGB 13.5 11/03/2023    HCT 40.4 11/03/2023    PLT 552 (H) 11/03/2023        Cardiac Enzymes:  No results found for: TROPI, ISTATTROPONI, BNP    Lipid Panel:  No results found for: CHOL  No results found for: TRIG  No results found for: HDL  No results found for: LDL Hemoglobin A1C:  No results found for: HGBA1C    Thyroid  Studies:  Lab Results   Component Value Date    TSH 0.47 01/31/2024     No results found for: T4FREE           Radiology:     No results found.        Cardiac studies:     ECG  EKG Results       Procedure Component Value Units Date/Time    ECG 12 lead (Normal) [[id number]] Collected: 04/06/24 1532     Updated: 04/06/24 1540     Ventricular Rate 93 BPM      Atrial Rate 93 BPM      P-R Interval 142 ms      QRS Duration 84 ms      Q-T Interval 366 ms      QTC Calculation (Bezet) 455 ms      P Axis 67 degrees      R Axis 68 degrees      T Axis 56 degrees      [facility] MUSE NARRATIVE AND IMPRESSION --     NORMAL SINUS RHYTHM WITH SINUS ARRHYTHMIA  NORMAL ECG  NO PREVIOUS ECGS AVAILABLE  Confirmed by Howell Sharper [phone]) on 04/06/2024 3:40:03 PM      Narrative:      NORMAL SINUS RHYTHM WITH SINUS ARRHYTHMIA  NORMAL ECG  NO PREVIOUS ECGS AVAILABLE  Confirmed by Howell Sharper [phone]) on 04/06/2024 3:40:03 PM           Assessment:     1. Hypermobility syndrome    2. Family history of CHF (congestive heart failure)    3. Morbid obesity (CMS/HCC)    4. Murmur        Plan:     Patient presents for cardiac evaluation with mother reportedly with cardiomyopathy although specifics of this is currently unknown to patient at this time.  She otherwise appears asymptomatic from a cardiac standpoint despite her physically active lifestyle.  Her EKG shows sinus rhythm and otherwise benign.  Exam is also relatively benign with only a soft murmur likely not representing significant valvular abnormality based on its quality.  We will obtain an echo to evaluate LV systolic function and valvular/structural abnormalities.  This has also been request by primary care likely for her workup for hypermobility and likely need to assess ascending aorta size.  I anticipate no further cardiac workup would be considered if there is no significant findings and our office will plan to contact patient regarding results.      We will try to get a copy of the reported TEEs that were done over 10 years ago from [facility] for our records.  She will see if she can find out more information regarding her mother's cardiomyopathy history.  Continue risk factor modification.      Follow-up:     Followup with me as needed if upcoming echo without significant findings.      Sharper Howell, MD, [facility]  [facility]  Electronically signed 04/06/2024, 4:07 PM    I saw and examined  the patient and spent 45 minutes providing care on the day of service including time spent in chart and data review, documentation, ordering, speaking with the patient, and communicating with the care team.            [1]   Patient Active Problem List  Diagnosis    UC (ulcerative colitis) (CMS/HCC)    Arthralgia    Knee pain    H/O endoscopy    Chronic ulcerative colitis with complication, unspecified location (CMS/HCC)    Anxiety    Current moderate episode of major depressive disorder,  unspecified whether recurrent (CMS/HCC)    Low back pain with sciatica, sciatica laterality unspecified, unspecified back pain laterality, unspecified chronicity    Hx of splenectomy    Hepatic abscess    Abnormal results of liver function studies    Severe obesity (CMS/HCC)   [2]   Past Medical History:  Diagnosis Date    Abnormal Pap smear of cervix Last pap 3 years ago    I have been positive for HPV before    Anxiety     Concussion 03/30/2012    Depression     Disorder of thyroid      Possibly. 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     PMDD (premenstrual dysphoric disorder)     Seasonal allergies     Ulcerative colitis (CMS/HCC)    [3]   Past Surgical History:  Procedure Laterality Date    APPENDECTOMY (OPEN)      SPLENECTOMY, TOTAL      WISDOM TOOTH EXTRACTION  07/28/2012    All 4 tooth   [4]   Family History  Problem Relation Name Age of Onset    Cardiomyopathy Mother      Implanted device Mother      Thyroid  disease Father      Diverticulitis Father      Hyperlipidemia Brother          Reportedly in childhood    Diabetes Maternal Grandmother      Heart disease Maternal Grandmother      Heart disease Maternal Grandfather      Diabetes Paternal Grandmother      Thyroid  disease Paternal Grandmother      Diabetes Paternal Grandfather      Heart disease Maternal Landscape Architect   [5]   Social History  Socioeconomic History    Marital status: Single   Tobacco Use    Smoking status: Never    Smokeless tobacco: Never   Vaping Use    Vaping status: Never Used   Substance and Sexual Activity    Alcohol use: Yes     Alcohol/week: 1.0 standard drink of alcohol     Types: 1 Standard drinks or equivalent per week     Comment: socially twice a month    Drug use: Yes     Frequency: 7.0 times per week     Types: Marijuana      Comment: daily    Sexual activity: Yes     Partners: Male     Social Drivers of Health     Financial Resource Strain: High Risk (01/30/2024)    Overall Financial Resource Strain (CARDIA)  Difficulty of Paying Living Expenses: Hard   Food Insecurity: Food Insecurity Present (01/30/2024)    Hunger Vital Sign     Worried About Running Out of Food in the Last Year: Sometimes true     Ran Out of Food in the Last Year: Sometimes true   Transportation Needs: No Transportation Needs (01/30/2024)    PRAPARE - Therapist, Art (Medical): No     Lack of Transportation (Non-Medical): No   Recent Concern: Transportation Needs - Unmet Transportation Needs (11/02/2023)    PRAPARE - Transportation     Lack of Transportation (Medical): Yes     Lack of Transportation (Non-Medical): Yes   Physical Activity: Sufficiently Active (01/30/2024)    Exercise Vital Sign     Days of Exercise per Week: 5 days     Minutes of Exercise per Session: 30 min   Stress: Stress Concern Present (01/30/2024)    [employer] of Occupational Health - Occupational Stress Questionnaire     Feeling of Stress : Very much   Intimate Partner Violence: Not At Risk (01/30/2024)    Humiliation, Afraid, Rape, and Kick questionnaire     Fear of Current or Ex-Partner: No     Emotionally Abused: No     Physically Abused: No     Sexually Abused: No   Housing Stability: Not At Risk (01/30/2024)    Housing Stability NCSS     Do you have housing?: Yes     Are you worried about losing your housing?: No   [6]   Allergies  Allergen Reactions    Cat Dander     Other      Tumeric and polllon

## 2024-04-11 ENCOUNTER — Encounter (INDEPENDENT_AMBULATORY_CARE_PROVIDER_SITE_OTHER): Payer: Self-pay | Admitting: Family Medicine

## 2024-04-11 ENCOUNTER — Ambulatory Visit (INDEPENDENT_AMBULATORY_CARE_PROVIDER_SITE_OTHER): Admitting: Family Medicine

## 2024-04-11 ENCOUNTER — Other Ambulatory Visit (INDEPENDENT_AMBULATORY_CARE_PROVIDER_SITE_OTHER): Payer: Self-pay | Admitting: Family Medicine

## 2024-04-11 VITALS — BP 119/73 | HR 81 | Temp 98.5°F | Wt 250.0 lb

## 2024-04-11 DIAGNOSIS — M5442 Lumbago with sciatica, left side: Secondary | ICD-10-CM

## 2024-04-11 DIAGNOSIS — M9900 Segmental and somatic dysfunction of head region: Secondary | ICD-10-CM

## 2024-04-11 DIAGNOSIS — M9904 Segmental and somatic dysfunction of sacral region: Secondary | ICD-10-CM

## 2024-04-11 DIAGNOSIS — M9903 Segmental and somatic dysfunction of lumbar region: Secondary | ICD-10-CM

## 2024-04-11 DIAGNOSIS — M9905 Segmental and somatic dysfunction of pelvic region: Secondary | ICD-10-CM

## 2024-04-11 DIAGNOSIS — M9902 Segmental and somatic dysfunction of thoracic region: Secondary | ICD-10-CM

## 2024-04-11 MED ORDER — TIZANIDINE HCL 4 MG PO CAPS: 4.0000 mg | ORAL_CAPSULE | Freq: Three times a day (TID) | ORAL | 1 refills | Status: DC

## 2024-04-11 MED ORDER — KETOROLAC TROMETHAMINE 60 MG/2ML IM SOLN
60.0000 mg | Freq: Once | INTRAMUSCULAR | Status: AC
  Administered 2024-04-11: 60 mg via INTRAMUSCULAR

## 2024-04-11 NOTE — Progress Notes (Signed)
 Have you seen any specialists/other providers since your last visit with [place]?    Yes  Cardiology     The patient was informed that the following HM items are still outstanding:   There are no preventive care reminders to display for this patient.

## 2024-04-11 NOTE — Progress Notes (Signed)
 ketorolac  (TORADOL ) IM injection 60 mg  verified by Keary Dee

## 2024-04-12 NOTE — Telephone Encounter (Signed)
 TiZANidine  (ZANAFLEX ) 4 MG capsule   PRIOR AUTHORIZATION NEEDED

## 2024-04-14 NOTE — Nursing Progress Note (Addendum)
 Pre-Procedural Evaluation Clinic Support Nursing Note:  [facility] Cardiologist ordered ECHO and has no been completed. Secure chat sent to card, is recommendation to complete ECHO prior to anesthesia?    Addendum:  04/14/24@ 1153 Cardio states ECHO was was requested by primary care as part of her hypermobility workup.  She has no preop clinical risk factors, adequate functional capacity, and awaiting low risk procedure.  She should be able to proceed as planned.

## 2024-04-16 ENCOUNTER — Encounter (INDEPENDENT_AMBULATORY_CARE_PROVIDER_SITE_OTHER): Payer: Self-pay | Admitting: Nurse Practitioner

## 2024-04-17 MED ORDER — SODIUM SULFATE-MAG SULFATE-KCL 1479-225-188 MG PO TABS: 12.0000 | ORAL_TABLET | Freq: Once | ORAL | 0 refills | Status: AC

## 2024-04-18 ENCOUNTER — Encounter (INDEPENDENT_AMBULATORY_CARE_PROVIDER_SITE_OTHER): Payer: Self-pay

## 2024-04-18 ENCOUNTER — Ambulatory Visit (INDEPENDENT_AMBULATORY_CARE_PROVIDER_SITE_OTHER)

## 2024-04-18 NOTE — PSS Phone Screening (Signed)
 Pre-Anesthesia Evaluation    Pre-op phone visit requested by:   Reason for pre-op phone visit: Patient anticipating COLONOSCOPY, SCREENING, ESOPHAGOGASTRODUODENOSCOPY (EGD), SCREENING procedure.         No orders of the defined types were placed in this encounter.    Pt states the only medication that will put her to sleep is propofol.      Arrival time per your doctor's direction.    Patient anticipating colonoscopy: please follow your doctor's bowel prep instructions; if you have not received these instructions, please call your doctor as soon as possible.  Day of procedure contact: [facility] [phone].    Please follow your doctor's arrival time.  History of Present Illness/Summary:      Problem List:  Medical Problems       Hospital Problem List  Date Reviewed: 04/11/2024   None        Non-Hospital Problem List  Date Reviewed: 04/11/2024          ICD-10-CM Priority Class Noted Diagnosed    UC (ulcerative colitis) (CMS/HCC) K51.90   06/18/2014     Overview Signed 06/18/2014  3:09 PM by Drucella Tinnie DEL, PA   GI- Dr Rollin Morita, [place]         Arthralgia M25.50   09/12/2014     Overview Signed 07/10/2022  4:47 PM by Elaine Blackbird   Diagnosis was deactivated April 2024 IMO load         Knee pain M25.569   09/12/2014     Overview Signed 07/10/2022  4:47 PM by Elaine Blackbird   Diagnosis was deactivated April 2024 IMO load         H/O endoscopy Z98.890   10/12/2014     Chronic ulcerative colitis with complication, unspecified location (CMS/HCC) K51.919   08/12/2021     Anxiety F41.9   08/12/2021     Current moderate episode of major depressive disorder, unspecified whether recurrent (CMS/HCC) F32.1   08/12/2021     Low back pain with sciatica, sciatica laterality unspecified, unspecified back pain laterality, unspecified chronicity M54.40   08/12/2021     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   08/12/2021     Hepatic abscess K75.0   08/12/2021     Abnormal results of liver function studies R94.5   08/13/2021     Severe  obesity (CMS/HCC) E66.01   02/09/2024         Medical History   Diagnosis Date    Abnormal Pap smear of cervix Last pap 3 years ago    I have been positive for HPV before    Anxiety     Concussion 03/30/2012    Depression     Hepatic abscess 10/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 Always use your most recent med list.                famotidine  10 MG tablet  Take 1 tablet (10 mg) by mouth 2 (two) times daily  Commonly known as: PEPCID   Medication Adjustments for Surgery: Take as prescribed     fexofenadine 30 MG disintegrating tablet  Dissolve 1 tablet (30 mg) in the mouth 2 (two) times daily  Commonly known as: ALLEGRA ODT  Medication Adjustments for Surgery: Take as prescribed     hydrocortisone  2.5 % cream  Apply topically 2 (two) times daily  Medication Adjustments for Surgery: Hold day of surgery     mesalamine  0.375 g 24 hr capsule  Take 4 capsules (1.5 g) by mouth once daily  Commonly known as: APRISO   Medication Adjustments for Surgery: Take as prescribed     omeprazole  40 MG capsule  Take 1 capsule (40 mg) by mouth once daily  Commonly known as: PriLOSEC  Medication Adjustments for Surgery: Take as prescribed     sodium sulfate -mag sulfate-KCl tablet  Take 12 tablets by mouth once for 1 dose  Commonly known as: SUTAB  Medication Adjustments for Surgery: Take as prescribed     * TiZANidine  6 MG capsule  Take 1 capsule (6 mg) by mouth 3 (three) times daily  Commonly known as: ZANAFLEX   Medication Adjustments for Surgery: Take as prescribed     * TiZANidine  4 MG capsule  Take 1 capsule (4 mg) by mouth 3 (three) times daily  Commonly known as: ZANAFLEX   Medication Adjustments for  Surgery: Take as prescribed           * This list has 2 medication(s) that are the same as other medications prescribed for you. Read the directions carefully, and ask your doctor or other care provider to review them with you.                Allergies[2]  Family History[3]  Social History     Occupational History    Not on file   Tobacco Use    Smoking status: Never    Smokeless tobacco: Never   Vaping Use    Vaping status: Never Used   Substance and Sexual Activity    Alcohol use: Yes     Alcohol/week: 1.0 standard drink of alcohol     Types: 1 Standard drinks or equivalent per week     Comment: socially twice a month    Drug use: Yes     Frequency: 7.0 times per week     Types: Marijuana    Sexual activity: Not on file       Menstrual History:   LMP / Status  Having periods     Patient's last menstrual period was 04/04/2024 (approximate).    Tubal Ligation?  No valid surgical or medical questions entered.               Exam Scores:   SDB score  OSA Risk Category: No Risk        STBUR score       PONV score  Nausea Risk: SEVERE RISK    MST score  MST Score: 0    PEN-FAST score       Frailty score       CHADsVasc            Visit Vitals  Ht 1.651 m (5' 5)   Wt 113.4 kg (250 lb)   LMP 04/04/2024 (Approximate)   BMI 41.60 kg/m   Obesity class 3 based on BMI.    Recent Labs   CBC (last 180 days) 11/03/23  1405   WBC 11.23*   RBC 4.76   Hemoglobin 13.5   Hematocrit 40.4  MCV 84.9   MCH 28.4   MCHC 33.4   RDW 14   Platelet Count 552*   MPV 9.7   nRBC % 0.0   Absolute nRBC 0.00     Recent Labs   BMP (last 180 days) 11/03/23  1405   Glucose 92   BUN 14   Creatinine 0.8   Sodium 142   Potassium 4.5   Chloride 110   CO2 25   Calcium 9.4   Anion Gap 7.0   GFR >60.0         Recent Labs   Other (last 180 days) 11/03/23  1405 01/31/24  1534 02/14/24  1254   TSH  --  0.47  --    Bilirubin, Total 0.4  --   --    ALT 31  --   --    AST (SGOT) 23  --   --    Protein, Total 7.1  --   --    Iron  --   --  52   Ferritin  --   --   23.80   Iron Saturation  --   --  15                        [1]   Past Surgical History:  Procedure Laterality Date    APPENDECTOMY (OPEN)      SPLENECTOMY, TOTAL      WISDOM TOOTH EXTRACTION  07/28/2012    All 4 tooth   [2]   Allergies  Allergen Reactions    Cat Dander     Other      Tumeric and polllon   [3]   Family History  Problem Relation Name Age of Onset    Cardiomyopathy Mother      Implanted device Mother      Thyroid  disease Father      Diverticulitis Father      Hyperlipidemia Brother          Reportedly in childhood    Diabetes Maternal Grandmother      Heart disease Maternal Grandmother      Heart disease Maternal Grandfather      Diabetes Paternal Grandmother      Thyroid  disease Paternal Grandmother      Diabetes Paternal Grandfather      Heart disease Maternal Landscape Architect

## 2024-04-19 ENCOUNTER — Encounter (INDEPENDENT_AMBULATORY_CARE_PROVIDER_SITE_OTHER): Payer: Self-pay

## 2024-04-20 ENCOUNTER — Encounter (INDEPENDENT_AMBULATORY_CARE_PROVIDER_SITE_OTHER): Payer: Self-pay | Admitting: Family Medicine

## 2024-04-20 ENCOUNTER — Ambulatory Visit (INDEPENDENT_AMBULATORY_CARE_PROVIDER_SITE_OTHER): Admitting: Family Medicine

## 2024-04-20 VITALS — BP 122/82 | HR 85 | Temp 98.2°F | Wt 250.0 lb

## 2024-04-20 DIAGNOSIS — M9900 Segmental and somatic dysfunction of head region: Secondary | ICD-10-CM

## 2024-04-20 DIAGNOSIS — M9904 Segmental and somatic dysfunction of sacral region: Secondary | ICD-10-CM

## 2024-04-20 DIAGNOSIS — M5442 Lumbago with sciatica, left side: Secondary | ICD-10-CM

## 2024-04-20 DIAGNOSIS — M9903 Segmental and somatic dysfunction of lumbar region: Secondary | ICD-10-CM

## 2024-04-20 DIAGNOSIS — M9905 Segmental and somatic dysfunction of pelvic region: Secondary | ICD-10-CM

## 2024-04-20 DIAGNOSIS — M9902 Segmental and somatic dysfunction of thoracic region: Secondary | ICD-10-CM

## 2024-04-20 MED ORDER — METHYLPREDNISOLONE SODIUM SUCC 40 MG IJ SOLR (WRAP)
31.2500 mg | Freq: Once | INTRAMUSCULAR | Status: AC
  Administered 2024-04-20: 31.25 mg via INTRAMUSCULAR

## 2024-04-20 MED ORDER — METHYLPREDNISOLONE SODIUM SUCC 40 MG IJ SOLR (WRAP): 20.0000 mg | Freq: Once | INTRAMUSCULAR | Status: DC

## 2024-04-20 NOTE — H&P (Signed)
 GI PRE PROCEDURE NOTE  GASTROENTEROLOGY PRE PROCEDURE NOTE H&P         Reason for Visit and History of Present Illness:  Cheryl Mcneil is a 29 y.o. female presents for EGD  Colonoscopy    GERD  UC dx age 36    Past Medical History  Medical History[1]    Past Surgical History  Past Surgical History[2]    Allergies  Allergies[3]    Prescriptions Prior to Admission[4]    Family History  @HXFAMILY @    Social History  Social History[5]    Review of Systems:  Review of Systems   Respiratory: Denies acute shortness of breath  Cardiovascular: Denies acute chest pain .  Gastrointestinal: no new abdominal pain    Neurologic: Denies confusion      Vital Signs:     Current Medications[6]     Objective:   There were no vitals taken for this visit.  Physical Exam   General Appearance:    Alert, no acute distress    Eyes:    anicteric   Lungs:     good respiratory effort    Heart:    Regular rate   Abdomen:     no peritoneal signs   Skin:   No jaundice   Neurologic:   Alert        Assessment/Recommendations:     Proceed with procedure(s) as planned    Risks/benefits/alternatives were discussed. Risks including but not limited to bleeding, infection, GI trauma, missed lesions (polyps, masses), incomplete procedure, wall perforation that may require emergency surgery for repair, organ injury, as well as potential anesthesia related complications (including but not limited to cardiac or respiratory arrest/lung aspiration, cardiac arrhythmias, stroke, even death). It was explained that endoscopic procedures are very good tests but they are not perfect and  lesions/abnormalities could be missed.    The patient voiced understanding and agrees to proceed.    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   [5]   Social History  Tobacco Use    Smoking status: Never    Smokeless tobacco: Never   Vaping Use    Vaping status: Never Used   Substance Use Topics    Alcohol use: Yes     Alcohol/week: 1.0 standard drink  of alcohol     Types: 1 Standard drinks or equivalent per week     Comment: socially twice a month    Drug use: Yes     Frequency: 7.0 times per week     Types: Marijuana   [6]   No current facility-administered medications for this encounter.     Current Outpatient Medications   Medication Sig Dispense Refill    famotidine  (PEPCID ) 10 MG tablet Take 1 tablet (10 mg) by mouth 2 (two) times daily (Patient taking differently: Take 1 tablet (10 mg) by mouth as needed)      fexofenadine (ALLEGRA ODT) 30 MG disintegrating tablet Dissolve 1 tablet (30 mg) in the mouth 2 (two) times daily      hydrocortisone  2.5 % cream Apply topically 2 (two) times daily 30 g 0    mesalamine  (APRISO ) 0.375 g 24 hr capsule Take 4 capsules (1.5 g) by mouth once daily 120 capsule 3    omeprazole   (PriLOSEC) 40 MG capsule Take 1 capsule (40 mg) by mouth once daily 30 capsule 11    TiZANidine  (ZANAFLEX ) 4 MG capsule Take 1 capsule (4 mg) by mouth 3 (three) times daily 90 capsule 1    TiZANidine  (ZANAFLEX ) 6 MG capsule Take 1 capsule (6 mg) by mouth 3 (three) times daily (Patient taking differently: Take 1 capsule (6 mg) by mouth once at bedtime) 30 capsule 2

## 2024-04-20 NOTE — Progress Notes (Signed)
 Have you seen any specialists/other providers since your last visit with us ?    No      The patient was informed that the following HM items are still outstanding:   There are no preventive care reminders to display for this patient.

## 2024-04-20 NOTE — Progress Notes (Signed)
 methylPREDNISolone  sodium succinate (Solu-MEDROL ) injection 31.25 mg vb: Alfonso Bale, MA

## 2024-04-20 NOTE — Progress Notes (Signed)
 LVM to confirm procedure. Provided callback number if needed [phone].

## 2024-04-20 NOTE — Progress Notes (Signed)
 Patient requesting call back to reschedule procedure [phone]

## 2024-04-21 ENCOUNTER — Encounter: Admission: RE | Payer: Self-pay | Source: Ambulatory Visit

## 2024-04-21 ENCOUNTER — Encounter (INDEPENDENT_AMBULATORY_CARE_PROVIDER_SITE_OTHER): Payer: Self-pay

## 2024-04-21 NOTE — Progress Notes (Signed)
 Sutab PA was denied by patients insurance

## 2024-04-21 NOTE — Progress Notes (Signed)
 PA for Sutab received via fax. PA placed on CoverMyMeds. PA is pending with a turnaround time of 24 hours.

## 2024-04-27 ENCOUNTER — Encounter (INDEPENDENT_AMBULATORY_CARE_PROVIDER_SITE_OTHER): Payer: Self-pay | Admitting: Family Medicine

## 2024-04-27 ENCOUNTER — Ambulatory Visit (INDEPENDENT_AMBULATORY_CARE_PROVIDER_SITE_OTHER): Admitting: Family Medicine

## 2024-04-27 DIAGNOSIS — M9903 Segmental and somatic dysfunction of lumbar region: Secondary | ICD-10-CM

## 2024-04-27 DIAGNOSIS — M5442 Lumbago with sciatica, left side: Secondary | ICD-10-CM

## 2024-04-27 DIAGNOSIS — M9900 Segmental and somatic dysfunction of head region: Secondary | ICD-10-CM

## 2024-04-27 DIAGNOSIS — M9904 Segmental and somatic dysfunction of sacral region: Secondary | ICD-10-CM

## 2024-04-27 DIAGNOSIS — M9905 Segmental and somatic dysfunction of pelvic region: Secondary | ICD-10-CM

## 2024-04-27 DIAGNOSIS — M9902 Segmental and somatic dysfunction of thoracic region: Secondary | ICD-10-CM

## 2024-04-27 MED ORDER — METHYLPREDNISOLONE 4 MG PO TBPK: ORAL_TABLET | ORAL | 0 refills | Status: AC

## 2024-04-28 ENCOUNTER — Encounter (INDEPENDENT_AMBULATORY_CARE_PROVIDER_SITE_OTHER): Payer: Self-pay | Admitting: Family Medicine

## 2024-04-28 NOTE — Progress Notes (Signed)
 Subjective:      Patient ID: Cheryl Mcneil is a 29 y.o. female     Chief Complaint   Patient presents with    OMT      HPI   Patient presents with low back pain for two years. Pain is improving with treatment and jaw has improved as well. Overall pain continues to improve.     The following portions of the patient's history were reviewed and updated as appropriate: allergies, current medications, past family history, past medical history, past social history, past surgical history, and problem list.    Current Medications:  Medications Taking[1]    Allergies:  Allergies[2]    ROS         LMP 04/04/2024 (Approximate)    Objective:   Physical Exam   - Right anterior innominate, L on L forward sacral torsion, bilateral tmj tightness, T4-T7FRLSL, L3-5ERRSR, dry needling done to bilateral erector spine muscles.       Assessment and Plan:   1. Left-sided low back pain with left-sided sciatica, unspecified chronicity  - methylPREDNISolone  (MEDROL  DOSEPAK) 4 MG tablet; follow package directions  Dispense: 21 tablet; Refill: 0    2. Somatic dysfunction of pelvis region    3. Somatic dysfunction of spine, sacral    4. Somatic dysfunction of head region    5. Somatic dysfunction of spine, lumbar    6. Somatic dysfunction of spine, thoracic     - Consent was obtained, patient was placed in the supine position and innominate treated with muscle energy, the lateral recumbent position and sacrum treated with muscle energy, the prone position and lumbar treated with soft tissue and fpr and thoracic treated with soft tissue and hvla, and the seated position and tmj treated with soft tissue and fpr. Recommended application of moist heat on the area of pain multiple times a day for the next few days. A heating pad may be used. Also recommend warm showers with water pounding on the area of pain. Always make sure to check temperature of water or object before applying it to avoid burns.     Risk & Benefits of any new  medication(s) were explained to the patient who verbalized understanding & agreed to the treatment plan.     [name], DO                         [1]   Outpatient Medications Marked as Taking for the 04/27/24 encounter (Office Visit) with Sharps Butler JINNY, DO   Medication Sig Dispense Refill    famotidine  (PEPCID ) 10 MG tablet Take 1 tablet (10 mg) by mouth 2 (two) times daily (Patient taking differently: Take 1 tablet (10 mg) by mouth as needed)      fexofenadine (ALLEGRA ODT) 30 MG disintegrating tablet Dissolve 1 tablet (30 mg) in the mouth 2 (two) times daily      hydrocortisone  2.5 % cream Apply topically 2 (two) times daily 30 g 0    mesalamine  (APRISO ) 0.375 g 24 hr capsule Take 4 capsules (1.5 g) by mouth once daily 120 capsule 3    omeprazole  (PriLOSEC) 40 MG capsule Take 1 capsule (40 mg) by mouth once daily 30 capsule 11    TiZANidine  (ZANAFLEX ) 4 MG capsule Take 1 capsule (4 mg) by mouth 3 (three) times daily 90 capsule 1    TiZANidine  (ZANAFLEX ) 6 MG capsule Take 1 capsule (6 mg) by mouth 3 (three) times daily (Patient taking  differently: Take 1 capsule (6 mg) by mouth once at bedtime) 30 capsule 2   [2]   Allergies  Allergen Reactions    Cat Dander     Other      Tumeric and polllon

## 2024-04-28 NOTE — Progress Notes (Signed)
 Subjective:      Patient ID: Cheryl Mcneil is a 29 y.o. female     Chief Complaint   Patient presents with    OMT Treatment      HPI   Patient presents with low back pain for two years. Stressful with work which is causing her tmj to flare up a bit. She continues to stretch and use heat which are helping. Needs refill on medication.     The following portions of the patient's history were reviewed and updated as appropriate: allergies, current medications, past family history, past medical history, past social history, past surgical history, and problem list.    Current Medications:  Medications Taking[1]    Allergies:  Allergies[2]    ROS         BP 119/73 (BP Site: Right arm, Patient Position: Sitting, Cuff Size: Medium)   Pulse 81   Temp 98.5 F (36.9 C) (Temporal)   Wt 113.4 kg (250 lb)   SpO2 98%   BMI 41.60 kg/m    Objective:   Physical Exam   - Left posterior innominate, L on L forward sacral torsion, bilateral tmj tightness, T4-T7FRLSL, L3-5ERLSL, dry needling done to bilateral erector spine muscles.       Assessment and Plan:   1. Left-sided low back pain with left-sided sciatica, unspecified chronicity  - TiZANidine  (ZANAFLEX ) 4 MG capsule; Take 1 capsule (4 mg) by mouth 3 (three) times daily  Dispense: 90 capsule; Refill: 1  - ketorolac  (TORADOL ) IM injection 60 mg    2. Somatic dysfunction of pelvis region    3. Somatic dysfunction of spine, sacral    4. Somatic dysfunction of head region    5. Somatic dysfunction of spine, lumbar    6. Somatic dysfunction of spine, thoracic     - Consent was obtained, patient was placed in the supine position and innominate treated with muscle energy, the lateral recumbent position and sacrum treated with muscle energy, the prone position and lumbar treated with soft tissue and fpr and thoracic treated with soft tissue and hvla, and the seated position and tmj treated with soft tissue and fpr. Recommended application of moist heat on the area of pain  multiple times a day for the next few days. A heating pad may be used. Also recommend warm showers with water pounding on the area of pain. Always make sure to check temperature of water or object before applying it to avoid burns.     Risk & Benefits of any new medication(s) were explained to the patient who verbalized understanding & agreed to the treatment plan.     [name], DO                       [1]   Outpatient Medications Marked as Taking for the 04/11/24 encounter (Office Visit) with Sharps Butler JINNY, DO   Medication Sig Dispense Refill    famotidine  (PEPCID ) 10 MG tablet Take 1 tablet (10 mg) by mouth 2 (two) times daily (Patient taking differently: Take 1 tablet (10 mg) by mouth as needed)      fexofenadine (ALLEGRA ODT) 30 MG disintegrating tablet Dissolve 1 tablet (30 mg) in the mouth 2 (two) times daily      hydrocortisone  2.5 % cream Apply topically 2 (two) times daily 30 g 0    omeprazole  (PriLOSEC) 40 MG capsule Take 1 capsule (40 mg) by mouth once daily 30 capsule 11  TiZANidine  (ZANAFLEX ) 6 MG capsule Take 1 capsule (6 mg) by mouth 3 (three) times daily (Patient taking differently: Take 1 capsule (6 mg) by mouth once at bedtime) 30 capsule 2   [2]   Allergies  Allergen Reactions    Cat Dander     Other      Tumeric and polllon

## 2024-05-02 ENCOUNTER — Encounter (INDEPENDENT_AMBULATORY_CARE_PROVIDER_SITE_OTHER): Payer: Self-pay | Admitting: Family Medicine

## 2024-05-03 ENCOUNTER — Other Ambulatory Visit (INDEPENDENT_AMBULATORY_CARE_PROVIDER_SITE_OTHER): Payer: Self-pay | Admitting: Family Medicine

## 2024-05-03 DIAGNOSIS — M5442 Lumbago with sciatica, left side: Secondary | ICD-10-CM

## 2024-05-04 NOTE — Telephone Encounter (Signed)
 Called pt's pharmacy and spoke to [facility] to inquire if PA needed for TiZANidine  (ZANAFLEX ) 4 MG capsule. He confirmed PA not needed, there was an issue with previous Rx but now resolved. Rx has been processed and filled as of 02/01 but pt has not picked up yet. New Rx also sent today but too early to be processed.

## 2024-05-04 NOTE — Telephone Encounter (Signed)
 Last office visit: 04/27/24  Last refill on: 01/31/24  Upcoming appointment: 05/10/2024

## 2024-05-05 ENCOUNTER — Ambulatory Visit (INDEPENDENT_AMBULATORY_CARE_PROVIDER_SITE_OTHER): Admitting: Family Medicine

## 2024-05-10 ENCOUNTER — Ambulatory Visit (INDEPENDENT_AMBULATORY_CARE_PROVIDER_SITE_OTHER): Admitting: Family Medicine
